# Patient Record
Sex: Female | Born: 1955 | Race: White | Hispanic: No | Marital: Married | Smoking: Current every day smoker
Health system: Southern US, Community
[De-identification: ages and names within clinical notes are randomized; demographics above are authoritative.]

## PROBLEM LIST (undated history)

## (undated) ENCOUNTER — Emergency Department (HOSPITAL_COMMUNITY): Discharge status: Against Medical Advice | Payer: Self-pay

## (undated) DIAGNOSIS — I10 Essential (primary) hypertension: Secondary | ICD-10-CM

## (undated) DIAGNOSIS — G2581 Restless legs syndrome: Secondary | ICD-10-CM

## (undated) DIAGNOSIS — E7800 Pure hypercholesterolemia, unspecified: Secondary | ICD-10-CM

## (undated) DIAGNOSIS — G8929 Other chronic pain: Secondary | ICD-10-CM

## (undated) DIAGNOSIS — E079 Disorder of thyroid, unspecified: Secondary | ICD-10-CM

## (undated) HISTORY — PX: BACK SURGERY: SHX140

## (undated) HISTORY — PX: NECK SURGERY: SHX720

---

## 2002-09-26 ENCOUNTER — Ambulatory Visit (HOSPITAL_COMMUNITY): Admission: RE | Admit: 2002-09-26 | Discharge: 2002-09-26 | Payer: Self-pay | Admitting: *Deleted

## 2002-09-26 ENCOUNTER — Encounter: Payer: Self-pay | Admitting: *Deleted

## 2003-07-17 ENCOUNTER — Encounter: Admission: RE | Admit: 2003-07-17 | Discharge: 2003-07-17 | Payer: Self-pay | Admitting: Neurosurgery

## 2003-07-27 ENCOUNTER — Ambulatory Visit (HOSPITAL_COMMUNITY): Admission: RE | Admit: 2003-07-27 | Discharge: 2003-07-27 | Payer: Self-pay | Admitting: Neurosurgery

## 2003-11-06 ENCOUNTER — Ambulatory Visit (HOSPITAL_COMMUNITY): Admission: RE | Admit: 2003-11-06 | Discharge: 2003-11-06 | Payer: Self-pay | Admitting: Neurosurgery

## 2004-06-29 ENCOUNTER — Encounter: Admission: RE | Admit: 2004-06-29 | Discharge: 2004-06-29 | Payer: Self-pay | Admitting: Neurosurgery

## 2004-07-13 ENCOUNTER — Encounter: Admission: RE | Admit: 2004-07-13 | Discharge: 2004-07-13 | Payer: Self-pay | Admitting: Neurosurgery

## 2004-08-18 ENCOUNTER — Ambulatory Visit (HOSPITAL_COMMUNITY): Admission: RE | Admit: 2004-08-18 | Discharge: 2004-08-18 | Payer: Self-pay | Admitting: Neurosurgery

## 2004-08-31 ENCOUNTER — Inpatient Hospital Stay (HOSPITAL_COMMUNITY): Admission: RE | Admit: 2004-08-31 | Discharge: 2004-09-01 | Payer: Self-pay | Admitting: Neurosurgery

## 2006-04-23 ENCOUNTER — Other Ambulatory Visit: Admission: RE | Admit: 2006-04-23 | Discharge: 2006-04-23 | Payer: Self-pay | Admitting: Family Medicine

## 2007-07-02 ENCOUNTER — Encounter: Admission: RE | Admit: 2007-07-02 | Discharge: 2007-07-02 | Payer: Self-pay | Admitting: Orthopedic Surgery

## 2007-10-23 ENCOUNTER — Ambulatory Visit (HOSPITAL_COMMUNITY): Admission: RE | Admit: 2007-10-23 | Discharge: 2007-10-24 | Payer: Self-pay | Admitting: Neurosurgery

## 2009-07-06 ENCOUNTER — Encounter: Admission: RE | Admit: 2009-07-06 | Discharge: 2009-07-06 | Payer: Self-pay | Admitting: Family Medicine

## 2010-08-30 NOTE — Op Note (Signed)
NAME:  Dawn Jacobs, Dawn Jacobs              ACCOUNT NO.:  [id number]   MEDICAL RECORD NO.:  [id number]          PATIENT TYPE:  OIB   LOCATION:  3537                         FACILITY:  [facility]   PHYSICIAN:  Coletta Memos, M.D.     DATE OF BIRTH:  May 19, 1955   DATE OF PROCEDURE:  10/23/2007  DATE OF DISCHARGE:                               OPERATIVE REPORT   PREOPERATIVE DIAGNOSES:  1. Cervical spondylosis C5-6, C6-7.  2. Cervical degenerative disk disease C5-6, C6-7.  3. Cervical radiculopathy, cervical stenosis.   POSTOPERATIVE DIAGNOSES:  1. Cervical spondylosis C5-6, C6-7.  2. Cervical degenerative disk disease C5-6, C6-7.  3. Cervical radiculopathy, cervical stenosis.   PROCEDURES:  1. Anterior cervical decompression at C5-6, C6-7.  2. Arthrodesis C5-6, 6-mm allograft, C6-7, 7 mm allograft.  3. Anterior instrumentation Vector plate with 16-XW self-tapping screw      from C5-C7.   COMPLICATIONS:  None.   SURGEON:  Coletta Memos, MD   ANESTHESIA:  General endotracheal.   INDICATIONS:  Dawn Jacobs presented with significant neck and upper  extremity pain.  Cervical MRI shows a great deal of spondylitic changes  and stenosis at 5-6 and 6-7.  She had spondylosis at 4-5, but no canal  or significant foraminal narrowing.  I therefore offered and she  accepted decompression at 5-6 and 6-7 level secondary to continued pain.   OPERATIVE NOTE:  Dawn Jacobs was brought to the operating room.  She was  intubated and placed under a general anesthetic.  She was positioned on  a horseshoe headrest with her head in slight extension.  Her neck was  prepped and she was draped in a sterile fashion.  I injected  approximately 4 mL of 0.5% lidocaine 1:200,000 strength with epinephrine  starting from the midline extending to the medial border of the left  sternocleidomastoid muscle.  I opened the skin with a #10 blade and took  the initial incision down to the level of the platysma.  I opened the  platysma in a horizontal fashion with Metzenbaum scissors.  I then  dissected both superiorly and inferiorly to the plane of the platysma  muscle to increase my working space within the wound.  Then, with blunt  and sharp dissection, I was able to create an avascular corridor to the  cervical spine.  I placed a spinal needle which I believed to be at 6-7.  I placed the same spinal needle moving at one level and was able to then  confirm that that was at C5-6.  I then prepared for the procedure by  reflecting the longus colli muscles bilaterally from C5-7.  I opened  both disk spaces with a #15 blade and did some preliminary diskectomy.  I then placed distraction pins one at C5, and one at C6, and distracted  the disk space.  I brought the microscope into the operative field and  then proceeded with the diskectomy and decompression at C5-6.   I used a high-speed drill and Kerrison punches to remove what were very  large osteophytes posteriorly.  She had a great deal of thickening in  the ligament and some calcification.  I was able to remove the posterior  longitudinal ligament and disk material and soft tissue material until  the thecal sac was well seen.  I then fully decompressed the thecal sac  at this disk level using Kerrison punches and a high-speed drill.  I  used curettes also to remove soft tissue in the lateral gutters and  around the uncovertebral joints.  I then aggressively decompressed both  C6 nerve roots using a Kerrison punch and a drill.  I was able to  decompress them through the neural foramen on both the right and left  sides.  After decompressing the canal and nerve roots, I then prepared  for arthrodesis.  I used a high-speed drill to even out the C6 and C5  vertebral bodies in the disk space.  I then placed a 6-mm graft.  I then  placed some Gelfoam at the side of the graft to control some oozing.  I  now turned my attention to the C6-7 level.   I removed the  distraction pins at C5 and placed it into C7.  I  distracted the disk space.  I then again brought the microscope into the  operative field and completed the diskectomy using high-speed drill,  Kerrison punches, and curettes.  I then removed again what were  impressive osteophytes in the uncovertebral region and in the disk  space.  I was able to fully decompress both C7 nerve roots and the  spinal canal at this level.  Having completed the decompression, I  turned my attention to the arthrodesis.   I placed a 7-mm graft after using the drill to again even out the  vertebral bodies at the disk space between C6 and C7.  I placed a graft  without difficulty.  I controlled the bleeding with Gelfoam.  I then  prepared for instrumentation.  I used a Vector plate and placed 6 screws  first by using hand drill and then self-tapping screws for one at C5,  C6, and C7.  Two screws placed at each level.  X-ray showed the plate  and screws to be in good position.  I then irrigated the wound.  I then  closed the wound after achieving hemostasis with Vicryl sutures to  reapproximate the platysma and subcuticular layers.  I applied Dermabond  for sterile dressing.           ______________________________  Coletta Memos, M.D.     KC/MEDQ  D:  10/23/2007  T:  10/24/2007  Job:  [id number]

## 2010-09-02 NOTE — Op Note (Signed)
NAME:  Dawn Jacobs, Dawn Jacobs              ACCOUNT NO.:  [id number]   MEDICAL RECORD NO.:  [id number]            PATIENT TYPE:   LOCATION:                                 FACILITY:   PHYSICIAN:  Coletta Memos, M.D.          DATE OF BIRTH:   DATE OF PROCEDURE:  08/31/2004  DATE OF DISCHARGE:                                 OPERATIVE REPORT   PREOPERATIVE DIAGNOSIS:  1.  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  2.  Lumbar radiculopathy.   POSTOPERATIVE DIAGNOSIS:  Lumbar radiculopathy.   PROCEDURE:  1.  Left L5-S1 transverse lumbar interbody fusion with 10-mm Synthes K-cage.  2.  Interbody arthrodesis using morselized allograft and bone morphogenetic      protein.  3.  Posterolateral lumbar fusion L4-5 using morselized allograft and bone      marrow harvest.  4.  Posterior spinal fusion nonsegmental L4-L5 with a large spire plate.   COMPLICATIONS:  None.   SURGEON:  Coletta Memos, M.D.   ASSISTANT:  Hilda Lias, M.D.   INDICATIONS:  Patient is a 55 year old woman whom I performed 2 previous  diskectomies on. She reported persistent pain in the left lower extremity.  She underwent a myelogram which showed that there was deflection of the sac  on the left side where previous operation had been. It was not clear at all  that this was a recurrent disk, but there was a soft tissue mass there. I  then offered, and she agreed, to undergo a redo diskectomy and arthrodesis  sine this was her third operation at that level.   OPERATIVE NOTE:  Dawn Jacobs was brought to the operating room, intubated,  and placed under a general anesthetic without difficulty. Her back was  prepped and she was draped in a sterile fashion. I infiltrated 20 cc 1/2%  lidocaine 1:200,000 strength epinephrine into the lumbar region using my  previous incision as a guide. I opened the skin going approximately 1-2 cm  both rostral and caudal to the end of my previous incision encompassing the  previous incision. I exposed  the lamina of L4-5.  I took another x-ray to  ensure I was in the correct interlaminar space. I was in the correct space;  and I did a redo diskectomy at L4-5 with microdissection. I did not find a  recurrent disk herniation. She had scar tissue there, but there is no  obvious frank recompression of the nerve root. She certainly had a  significant amount of epidural fat, but that was soft. Again, she did not  have a recurrent disk herniation.   I performed the diskectomy using that T-lift tools, rasp, and cutting  instruments along with __________ tools.  I created a space for a 10-mm, T-  lift graft. With Dr. Cassandria Santee assistance we did that using a spinous process  distractor. The 10-mm cage was placed with morselized allograft into the  interbody space; also with the BMP placed anterior to the cage. After that  was done, I placed more bone posterior to the cage and lateral to it. I  placed bone and BMP into the left L4-5 facette joint after decorticating the  bone. I also placed BMP alongside the lamina of L4-L5 on the  right side after decorticating the lamina on that side. I then placed a  large spire plate without difficulty at L4-5 with Dr. Cassandria Santee assistance. X-  rays showed the plate and cage to be in good position. I then closed in a  running layer fashion using Vicryl sutures. Dermabond used for a sterile  dressing. The patient tolerated the procedure well.                                        ___________________________________________  Coletta Memos, M.D.    KC/MEDQ  D:  08/31/2004  T:  08/31/2004  Job:  [id number]

## 2010-09-02 NOTE — Op Note (Signed)
NAME:  [name], [name]                        ACCOUNT NO.:  [id number]   MEDICAL RECORD NO.:  [id number]                   PATIENT TYPE:  OIB   LOCATION:  3003                                 FACILITY:  [facility]   PHYSICIAN:  Coletta Memos, M.D.                  DATE OF BIRTH:  Dec 03, 1955   DATE OF PROCEDURE:  11/06/2003  DATE OF DISCHARGE:  11/06/2003                                 OPERATIVE REPORT   PREOPERATIVE DIAGNOSES:  1. Recurrent disk herniation, L4-5, left.  2. Lumbar radiculopathy.   POSTOPERATIVE DIAGNOSES:  1. Recurrent disk herniation, L4-5, left.  2. Lumbar radiculopathy.   PROCEDURE:  Re-do diskectomy, L4-5, with microdissection.   COMPLICATIONS:  None.   SURGEON:  Coletta Memos, M.D.   ASSISTANT:  Hewitt Shorts, M.D.   INDICATIONS:  Dawn Jacobs is a 55 year old whom I took to the operating  room in April of 2005 for a herniated disk at L4-5 causing left lower  extremity pain.  She did well immediately after surgery, then started to  have some problems with heaviness and pain at times in the lower  extremities.  A repeat MRI showed that she had a very large recurrence at L4-  5.  I therefore recommended and she agreed to undergo redo diskectomy at L4-  5.   OPERATIVE NOTE:  Dawn Jacobs was brought to the operating room and placed  under a general anesthetic without difficulty.  She was rolled prone onto a  Wilson frame and all pressure points were properly padded.  Her skin was  prepped and she was draped in a sterile fashion.  I infiltrated 7 cubic  centimeters of 0.5% lidocaine, 1:200,000 strength epinephrine into the  lumbar region.  I opened the skin with a #10 blade and took that incision  down to the thoracolumbar fascia.  I then exposed the lamina of L4 and L5.  I took an x-ray and it showed that I was at the L5-S1 interlaminar position,  so then I moved up one interlaminar space to the correct level.  I then  dissected scar from the undersurface  of the L4 lamina and the lateral  margins of the bony opening using a curet.  I was able to retract the thecal  sac medially and, then, found disk material.  I then removed approximately 4-  5 large pieces of disk material, and the thecal sac then felt well  decompressed.  With Dr. Earl Gala assistance and microscopic dissection, we  further explored the disk space and the nerve root, and I did not appreciate  any other compression at that level.  I then irrigated the wound and closed  the wound in a layer fashion using Vicryl sutures.  Dermabond was used for a  sterile dressing.  The patient tolerated the procedure well, moving all  extremities.  Coletta Memos, M.D.    KC/MEDQ  D:  11/06/2003  T:  11/07/2003  Job:  [id number]

## 2010-09-02 NOTE — Op Note (Signed)
NAME:  Dawn Jacobs, Dawn Jacobs                        ACCOUNT NO.:  [id number]   MEDICAL RECORD NO.:  [id number]                   PATIENT TYPE:  OIB   LOCATION:  3014                                 FACILITY:  [facility]   PHYSICIAN:  Coletta Memos, M.D.                  DATE OF BIRTH:  12/16/1955   DATE OF PROCEDURE:  07/27/2003  DATE OF DISCHARGE:                                 OPERATIVE REPORT   PREOPERATIVE DIAGNOSES:  1. Displaced disk, left L4-5.  2. Left L5 radiculopathy.   POSTOPERATIVE DIAGNOSES:  1. Displaced disk, left L4-5.  2. Left L5 radiculopathy.   PROCEDURE:  Left L4 semi-hemilaminectomy and diskectomy with micro-  dissection.   SURGEON:  Coletta Memos, M.D.   ASSISTANT:  Hilda Lias, M.D.   COMPLICATIONS:  None.   ANESTHESIA:  General endotracheal.   INDICATIONS FOR PROCEDURE:  The patient is a 55 year old who has been  treated conservatively for approximately one year for a herniated disk at L5  on the left side.  I therefore recommended an operative decompression.  She  agreed and wished to proceed.   DESCRIPTION OF PROCEDURE:  The patient was brought to the operating room,  intubated, and placed under with general anesthetic without difficulty.  She  was rolled prone onto the Wilson frame and all pressure points were properly  padded.  Her back was prepped, and she was draped in a sterile fashion.  I  infiltrated 6 mL, 0.5% lidocaine, 1:200,000 strength epinephrine into the  lumbar region and the paraspinous musculature on the left side.  I opened  the skin with a #10 blade and took that down to the thoracolumbar fascia  sharply.  Then using monopolar cautery I exposed the lamina of L4 and L5.  I  took another x-ray and added a double ________ knife placed underneath the  lamina of L5.  The moving to the correct level, I performed a semi-  hemilaminectomy of L4 using a high-speed air drill and Kerrison punches.  I  removed the ligament of flavum with a  rostral and caudal direction, exposing  the thecal sac and L5 nerve root.  I was able to retract that medially and  easily identified what was a large disk herniation at the L4-5 disk space on  the left side.  I used bipolar cautery to cauterize a few epidural veins.  I  divided them sharply to fully expose the disk space.  I then used a #15  blade to open the disk, cutting a small square into the disk space.  Then  with a combination of pituitary rongeurs and Epstein curets, I emptied the  disk space with the help of Dr. Jeral Fruit and with micro-dissection.  After I  felt that there was adequate decompression of the disk space and of the left  L5 nerve root, I inspected it with a small coronary dilator probe.  I felt  that the nerve was very well decompressed and that there were no residual  disk fragments left.  I again inspected the disk space with Dr. Jeral Fruit.  I was then satisfied with my decompression.  I irrigated the wound with  normal saline.  We then closed the wound in a layered fashion using Vicryl  sutures to reapproximate the thoracolumbar fascia and subcutaneous tissues.  Also ___________Dermabond used for a sterile dressing.                                               Coletta Memos, M.D.    KC/MEDQ  D:  07/27/2003  T:  07/28/2003  Job:  [id number]

## 2011-01-12 LAB — CBC
Hemoglobin: 13.9
MCHC: 34.2
Platelets: 214
RBC: 4.24

## 2011-01-12 LAB — BASIC METABOLIC PANEL
CO2: 27
Chloride: 102
Creatinine, Ser: 0.79
GFR calc Af Amer: >60
Glucose, Bld: 90
Sodium: 136

## 2012-08-06 ENCOUNTER — Telehealth: Payer: Self-pay | Admitting: Medical Oncology

## 2012-08-15 NOTE — Telephone Encounter (Signed)
Opened in error

## 2014-11-05 ENCOUNTER — Other Ambulatory Visit: Payer: Self-pay | Admitting: Obstetrics & Gynecology

## 2014-11-05 DIAGNOSIS — R928 Other abnormal and inconclusive findings on diagnostic imaging of breast: Secondary | ICD-10-CM

## 2014-11-10 ENCOUNTER — Ambulatory Visit
Admission: RE | Admit: 2014-11-10 | Discharge: 2014-11-10 | Disposition: A | Discharge status: Home / Self Care | Payer: BLUE CROSS/BLUE SHIELD | Source: Ambulatory Visit | Attending: Obstetrics & Gynecology | Admitting: Obstetrics & Gynecology

## 2014-11-10 DIAGNOSIS — R928 Other abnormal and inconclusive findings on diagnostic imaging of breast: Secondary | ICD-10-CM

## 2016-11-12 ENCOUNTER — Ambulatory Visit (HOSPITAL_COMMUNITY)
Admission: EM | Admit: 2016-11-12 | Discharge: 2016-11-12 | Disposition: A | Discharge status: Home / Self Care | Payer: BLUE CROSS/BLUE SHIELD

## 2016-11-12 ENCOUNTER — Emergency Department (HOSPITAL_COMMUNITY)
Admission: EM | Admit: 2016-11-12 | Discharge: 2016-11-12 | Disposition: A | Discharge status: Home / Self Care | Payer: BLUE CROSS/BLUE SHIELD | Attending: Emergency Medicine | Admitting: Emergency Medicine

## 2016-11-12 ENCOUNTER — Encounter (HOSPITAL_COMMUNITY): Payer: Self-pay | Admitting: Emergency Medicine

## 2016-11-12 DIAGNOSIS — F17200 Nicotine dependence, unspecified, uncomplicated: Secondary | ICD-10-CM | POA: Insufficient documentation

## 2016-11-12 DIAGNOSIS — Z79899 Other long term (current) drug therapy: Secondary | ICD-10-CM | POA: Insufficient documentation

## 2016-11-12 DIAGNOSIS — I10 Essential (primary) hypertension: Secondary | ICD-10-CM | POA: Insufficient documentation

## 2016-11-12 DIAGNOSIS — W25XXXA Contact with sharp glass, initial encounter: Secondary | ICD-10-CM | POA: Insufficient documentation

## 2016-11-12 DIAGNOSIS — Y92007 Garden or yard of unspecified non-institutional (private) residence as the place of occurrence of the external cause: Secondary | ICD-10-CM | POA: Insufficient documentation

## 2016-11-12 DIAGNOSIS — Y93H9 Activity, other involving exterior property and land maintenance, building and construction: Secondary | ICD-10-CM | POA: Insufficient documentation

## 2016-11-12 DIAGNOSIS — Y999 Unspecified external cause status: Secondary | ICD-10-CM | POA: Insufficient documentation

## 2016-11-12 DIAGNOSIS — S61210A Laceration without foreign body of right index finger without damage to nail, initial encounter: Secondary | ICD-10-CM

## 2016-11-12 HISTORY — DX: Disorder of thyroid, unspecified: E07.9

## 2016-11-12 HISTORY — DX: Pure hypercholesterolemia, unspecified: E78.00

## 2016-11-12 HISTORY — DX: Restless legs syndrome: G25.81

## 2016-11-12 HISTORY — DX: Other chronic pain: G89.29

## 2016-11-12 HISTORY — DX: Essential (primary) hypertension: I10

## 2016-11-12 MED ORDER — LIDOCAINE HCL 2 % IJ SOLN
5.0000 mL | Freq: Once | INTRAMUSCULAR | Status: AC
  Administered 2016-11-12: 5 mL
  Filled 2016-11-12: qty 20

## 2016-11-12 MED ORDER — BACITRACIN ZINC 500 UNIT/GM EX OINT
TOPICAL_OINTMENT | Freq: Once | CUTANEOUS | Status: AC
  Administered 2016-11-12: 1 via TOPICAL

## 2016-11-12 NOTE — ED Provider Notes (Signed)
[facility]-EMERGENCY DEPT Provider Note   CSN: [id number] Arrival date & time: 11/12/16  2110 By signing my name below, I, Levon HedgerElizabeth Hall, attest that this documentation has been prepared under the direction and in the presence of non-physician practitioner, Shanna CiscoJamie Ward, PA-C. Electronically Signed: Levon HedgerElizabeth Hall, Scribe. 11/12/2016. 10:04 PM.   History   Chief Complaint Chief Complaint  Patient presents with  . Finger Injury   HPI Dawn Jacobs is a 61 y.o. female who presents to the Emergency Department complaining of a laceration to her right index finger sustained tonight ~3 hours PTA. Pt states she was attempting to knock down a wasps nest on a window and struck the window with her fist. The window shattered and a piece of broken glass cut her finger. She reports associated sudden onset, moderate, stinging pain to the area. She has not cleaned the wound. No OTC treatments tried for these symptoms PTA.  Tetanus UTD. She denies any numbness, tingling, or decreased ROM.   The history is provided by the patient. No language interpreter was used.   Past Medical History:  Diagnosis Date  . Chronic pain   . High cholesterol   . Hypertension   . Restless legs   . Thyroid disease     There are no active problems to display for this patient.   Past Surgical History:  Procedure Laterality Date  . BACK SURGERY    . NECK SURGERY      OB History    No data available       Home Medications    Prior to Admission medications   Medication Sig Start Date End Date Taking? Authorizing Provider  Estradiol (ESTRACE PO) Take by mouth.    [provider]  HYDROcodone-acetaminophen (NORCO/VICODIN) 5-325 MG tablet Take 1 tablet by mouth every 6 (six) hours as needed for moderate pain.    [provider]  Levothyroxine Sodium (SYNTHROID PO) Take by mouth.    [provider]  LISINOPRIL PO Take by mouth.    [provider]  ROPINIROLE HCL ER PO Take by mouth.     [provider]  SIMVASTATIN PO Take by mouth.    [provider]    Family History No family history on file.  Social History Social History  Substance Use Topics  . Smoking status: Current Every Day Smoker  . Smokeless tobacco: Not on file  . Alcohol use No     Allergies   Patient has no known allergies.   Review of Systems Review of Systems  Skin: Positive for wound.  Neurological: Negative for weakness and numbness.   Physical Exam Updated Vital Signs BP 129/74 (BP Location: Right Arm)   Pulse 78   Temp 98.1 F (36.7 C) (Oral)   Resp 16   Ht 5' 3.5" (1.613 m)   Wt 74.8 kg (165 lb)   SpO2 99%   BMI 28.77 kg/m   Physical Exam  Constitutional: She appears well-developed and well-nourished. No distress.  HENT:  Head: Normocephalic and atraumatic.  Neck: Neck supple.  Cardiovascular: Normal rate, regular rhythm and normal heart sounds.   No murmur heard. Pulmonary/Chest: Effort normal and breath sounds normal. No respiratory distress. She has no wheezes. She has no rales.  Musculoskeletal: Normal range of motion.  2 cm laceration to dorsal aspect of right index finger. Sensation intact. FROM. 2+ radial pulse.   Neurological: She is alert.  Skin: Skin is warm and dry.  Nursing note and vitals reviewed.  ED Treatments / Results  DIAGNOSTIC STUDIES:  Oxygen Saturation is 99% on RA, normal by my interpretation.    COORDINATION OF CARE:  10:04 PM Discussed treatment plan which includes laceration repair with pt at bedside and pt agreed to plan.   Labs (all labs ordered are listed, but only abnormal results are displayed) Labs Reviewed - No data to display  EKG  EKG Interpretation None      Radiology No results found.  Procedures .Marland Kitchen.Laceration Repair Date/Time: 11/12/2016 10:07 PM Performed by: Janyth ContesWARD, JAIME PILCHER Authorized by: Janyth ContesWARD, JAIME PILCHER   Consent:    Consent obtained:  Verbal   Consent given by:   Patient Anesthesia (see MAR for exact dosages):    Anesthesia method:  Local infiltration   Local anesthetic:  Lidocaine 2% w/o epi Laceration details:    Location:  Finger   Finger location:  R index finger   Length (cm):  2 Repair type:    Repair type:  Simple Pre-procedure details:    Preparation:  Patient was prepped and draped in usual sterile fashion Exploration:    Contaminated: no   Treatment:    Area cleansed with:  Betadine   Amount of cleaning:  Standard   Irrigation solution:  Sterile saline   Visualized foreign bodies/material removed: no   Skin repair:    Repair method:  Sutures   Suture size:  5-0   Suture material:  Prolene   Suture technique:  Simple interrupted   Number of sutures: 3. Approximation:    Approximation:  Close   Vermilion border: well-aligned   Post-procedure details:    Dressing:  Tube gauze and antibiotic ointment   Patient tolerance of procedure:  Tolerated well, no immediate complications    (including critical care time)  Medications Ordered in ED Medications  lidocaine (XYLOCAINE) 2 % (with pres) injection 100 mg (5 mLs Infiltration Given 11/12/16 2255)  bacitracin ointment (1 application Topical Given 11/12/16 2255)     Initial Impression / Assessment and Plan / ED Course  I have reviewed the triage vital signs and the nursing notes.  Pertinent labs & imaging results that were available during my care of the patient were reviewed by me and considered in my medical decision making (see chart for details).    Dawn Jacobs is a 61 y.o. female who presents to ED for laceration of index finger. Wound thoroughly cleaned in ED today. Wound explored and bottom of wound seen in a bloodless field. Laceration repaired as dictated above. Patient counseled on home wound care. Follow up with PCP/urgent care or return to ER for suture removal in 10 days. Patient was urged to return to the Emergency Department for worsening pain, swelling,  expanding erythema especially if it streaks away from the affected area, fever, or for any additional concerns. Patient verbalized understanding. All questions answered.   Final Clinical Impressions(s) / ED Diagnoses   Final diagnoses:  Laceration of right index finger without foreign body without damage to nail, initial encounter    New Prescriptions Discharge Medication List as of 11/12/2016 10:51 PM     I personally performed the services described in this documentation, which was scribed in my presence. The recorded information has been reviewed and is accurate.    Ward, Chase PicketJaime Pilcher, PA-C 11/13/16 [id number]    Maia PlanLong, Joshua G, MD 11/15/16 [id number]

## 2016-11-12 NOTE — ED Triage Notes (Signed)
Laceration occurred approx 3 hours ago.  .  Laceration to right index finger.  Laceration across knuckle.  Able to bend and straighten right index finger

## 2016-11-12 NOTE — Discharge Instructions (Signed)
It was my pleasure taking care of you today!   Keep wound clean with mild soap and water. Keep area covered with a topical antibiotic ointment and bandage, keep bandage dry, and do not submerge in water for 24 hours. Ice and elevate for additional pain relief and swelling. Alternate between ibuprofen and Tylenol for additional pain relief. [facility] or ER in approximately 10 days for wound recheck and suture removal. Monitor area for signs of infection to include, but not limited to: increasing pain, spreading redness, drainage/pus, worsening swelling, or fevers. Return to emergency department for emergent changing or worsening symptoms.   WOUND CARE Keep area clean and dry for 24 hours. Do not remove bandage, if applied. After 24 hours,you should change it at least once a day. Also, change the dressing if it becomes wet or dirty, or as directed by your caregiver.  Wash the wound with soap and water 2 times a day. Rinse the wound off with water to remove all soap. Pat the wound dry with a clean towel.  You may shower as usual after the first 24 hours. Do not soak the wound in water until the sutures are removed.  Return if you experience any of the following signs of infection: Swelling, redness, pus drainage, streaking, fever >101.0 Return if you experience excessive bleeding that does not stop after 15-20 minutes of constant, firm pressure.

## 2016-11-12 NOTE — Progress Notes (Signed)
Orthopedic Tech Progress Note Patient Details:  Dawn JarredCynthia L Jacobs 1956/01/30 [id number]  Ortho Devices Type of Ortho Device: Finger splint Ortho Device/Splint Location: applied splint to right index finger.  pt tolerated application very well.   Right Hand/ Index (pointer) finger.  Ortho Device/Splint Interventions: Application, Adjustment   Alvina ChouWilliams, Zaki Gertsch C 11/12/2016, 11:31 PM

## 2016-11-12 NOTE — ED Triage Notes (Signed)
Patient declines xray at this time. Requests to see provider prior to having imaging done.

## 2016-11-12 NOTE — ED Notes (Signed)
PA at bedside for suture repair. 

## 2016-11-12 NOTE — ED Triage Notes (Signed)
Patient arrives with complaint of right index finger injury. States that she cut her hand on a piece of broken glass.  [facility] Cut is across knuckle of finger.

## 2016-11-12 NOTE — ED Notes (Signed)
Wound to finger dressed with bacitracin, 2x2 and gauze. Ortho paged for static splint

## 2017-07-24 ENCOUNTER — Other Ambulatory Visit: Payer: Self-pay | Admitting: Obstetrics & Gynecology

## 2017-11-15 ENCOUNTER — Other Ambulatory Visit: Payer: Self-pay | Admitting: Obstetrics & Gynecology

## 2017-11-15 DIAGNOSIS — N6310 Unspecified lump in the right breast, unspecified quadrant: Secondary | ICD-10-CM

## 2017-11-21 ENCOUNTER — Ambulatory Visit
Admission: RE | Admit: 2017-11-21 | Discharge: 2017-11-21 | Disposition: A | Discharge status: Home / Self Care | Payer: No Typology Code available for payment source | Source: Ambulatory Visit | Attending: Obstetrics & Gynecology | Admitting: Obstetrics & Gynecology

## 2017-11-21 ENCOUNTER — Ambulatory Visit
Admission: RE | Admit: 2017-11-21 | Discharge: 2017-11-21 | Disposition: A | Discharge status: Home / Self Care | Payer: BLUE CROSS/BLUE SHIELD | Source: Ambulatory Visit | Attending: Obstetrics & Gynecology | Admitting: Obstetrics & Gynecology

## 2017-11-21 DIAGNOSIS — N6310 Unspecified lump in the right breast, unspecified quadrant: Secondary | ICD-10-CM

## 2019-07-11 ENCOUNTER — Ambulatory Visit: Payer: Self-pay | Attending: Internal Medicine

## 2019-07-11 DIAGNOSIS — Z23 Encounter for immunization: Secondary | ICD-10-CM

## 2019-07-11 NOTE — Progress Notes (Signed)
   Covid-19 Vaccination Clinic  Name:  MIKIYA NEBERGALL    MRN: [id number] DOB: April 08, 1956  07/11/2019  Ms. Pippenger was observed post Covid-19 immunization for 15 minutes without incident. She was provided with Vaccine Information Sheet and instruction to access the V-Safe system.   Ms. Thammavong was instructed to call 911 with any severe reactions post vaccine: Marland Kitchen Difficulty breathing  . Swelling of face and throat  . A fast heartbeat  . A bad rash all over body  . Dizziness and weakness   Immunizations Administered    Name Date Dose VIS Date Route   Pfizer COVID-19 Vaccine 07/11/2019  2:52 PM 0.3 mL 03/28/2019 Intramuscular   Manufacturer: [employer], [employer]   Lot: [id number]   NDC: [address]-2

## 2019-08-05 ENCOUNTER — Ambulatory Visit: Payer: Self-pay | Attending: Internal Medicine

## 2019-08-05 DIAGNOSIS — Z23 Encounter for immunization: Secondary | ICD-10-CM

## 2019-08-05 NOTE — Progress Notes (Signed)
   Covid-19 Vaccination Clinic  Name:  Dawn Jacobs    MRN: [id number] DOB: 1955/12/19  08/05/2019  [name] was observed post Covid-19 immunization for 15 minutes without incident. She was provided with Vaccine Information Sheet and instruction to access the V-Safe system.   [name] was instructed to call 911 with any severe reactions post vaccine: Marland Kitchen Difficulty breathing  . Swelling of face and throat  . A fast heartbeat  . A bad rash all over body  . Dizziness and weakness   Immunizations Administered    Name Date Dose VIS Date Route   Pfizer COVID-19 Vaccine 08/05/2019  9:55 AM 0.3 mL 06/11/2018 Intramuscular   Manufacturer: [employer], [employer]   Lot: [id number]   NDC: [address]-1

## 2020-03-19 DIAGNOSIS — L243 Irritant contact dermatitis due to cosmetics: Secondary | ICD-10-CM | POA: Diagnosis not present

## 2020-03-31 DIAGNOSIS — Z90710 Acquired absence of both cervix and uterus: Secondary | ICD-10-CM | POA: Diagnosis not present

## 2020-03-31 DIAGNOSIS — Z6827 Body mass index (BMI) 27.0-27.9, adult: Secondary | ICD-10-CM | POA: Diagnosis not present

## 2020-03-31 DIAGNOSIS — Z01419 Encounter for gynecological examination (general) (routine) without abnormal findings: Secondary | ICD-10-CM | POA: Diagnosis not present

## 2020-03-31 DIAGNOSIS — Z1272 Encounter for screening for malignant neoplasm of vagina: Secondary | ICD-10-CM | POA: Diagnosis not present

## 2020-04-15 IMAGING — MG DIGITAL DIAGNOSTIC BILATERAL MAMMOGRAM WITH TOMO AND CAD
8 series · 8 of 24 positions shown · non-contrast
Comparison: Previous exam(s).

CLINICAL DATA: Patient underwent diagnostic right breast
mammography and ultrasound in October 2014, which revealed a probably
benign complicated cyst in the right breast at 10 o'clock. She was
to have a 6 month follow-up. This is her first exam since that
diagnostic study.

EXAM:
DIGITAL DIAGNOSTIC BILATERAL MAMMOGRAM WITH CAD AND TOMO
ULTRASOUND RIGHT BREAST

[R CC synth-2D]
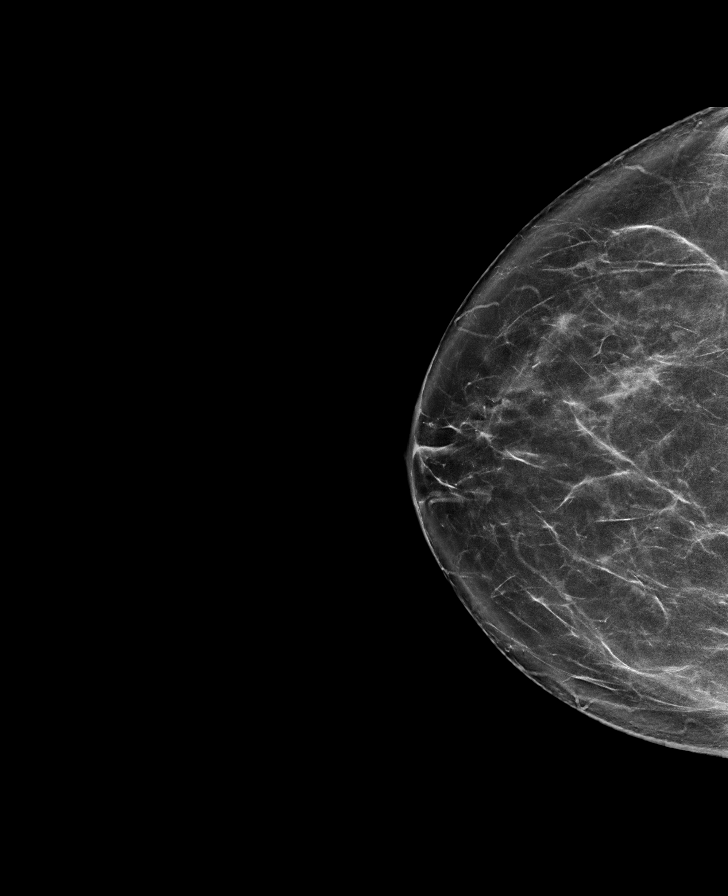

[L CC synth-2D]
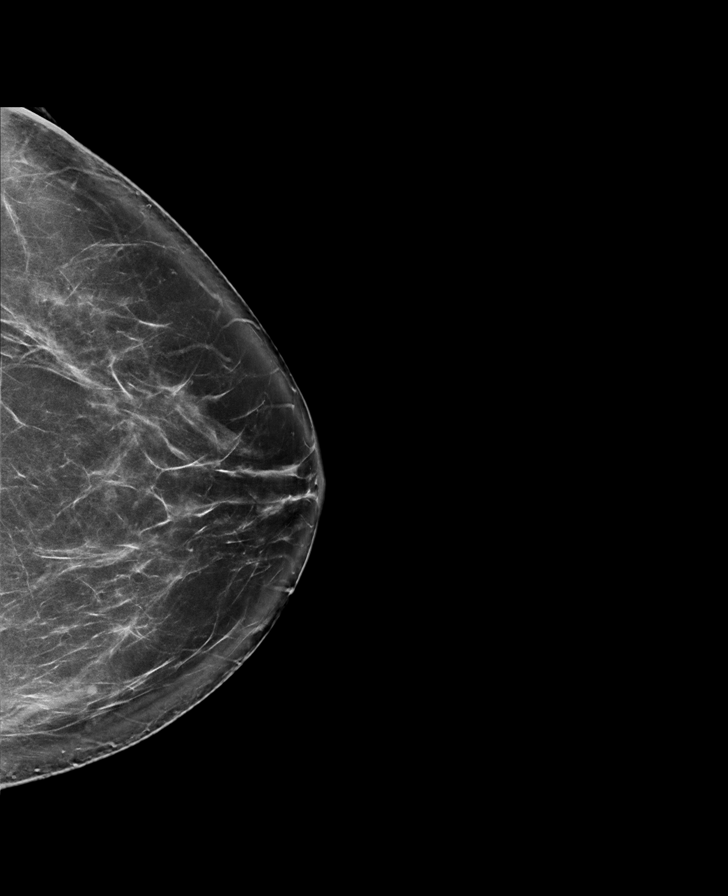

[L MLO synth-2D]
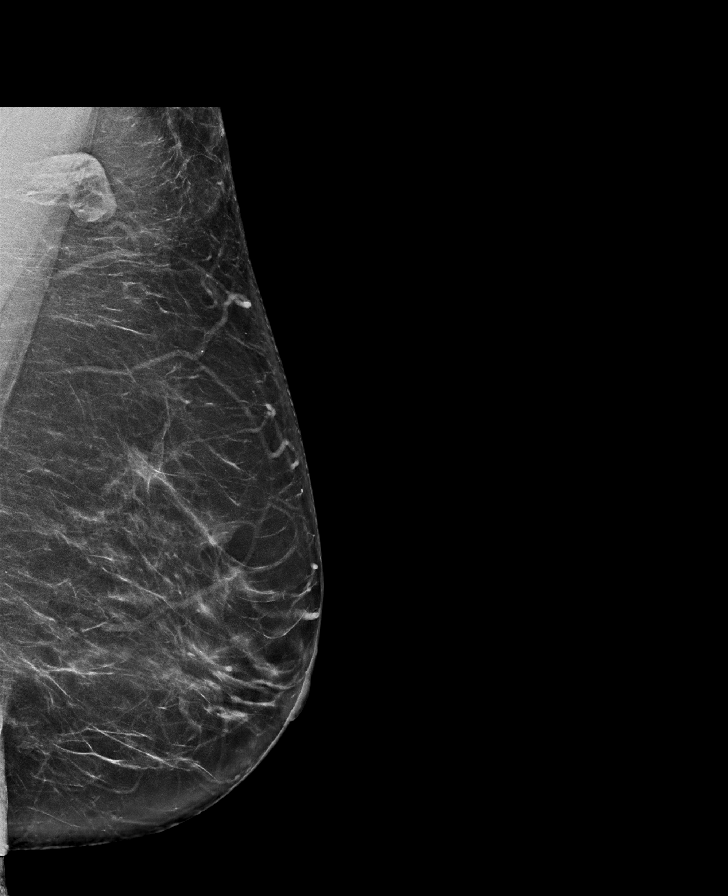

[R MLO synth-2D]
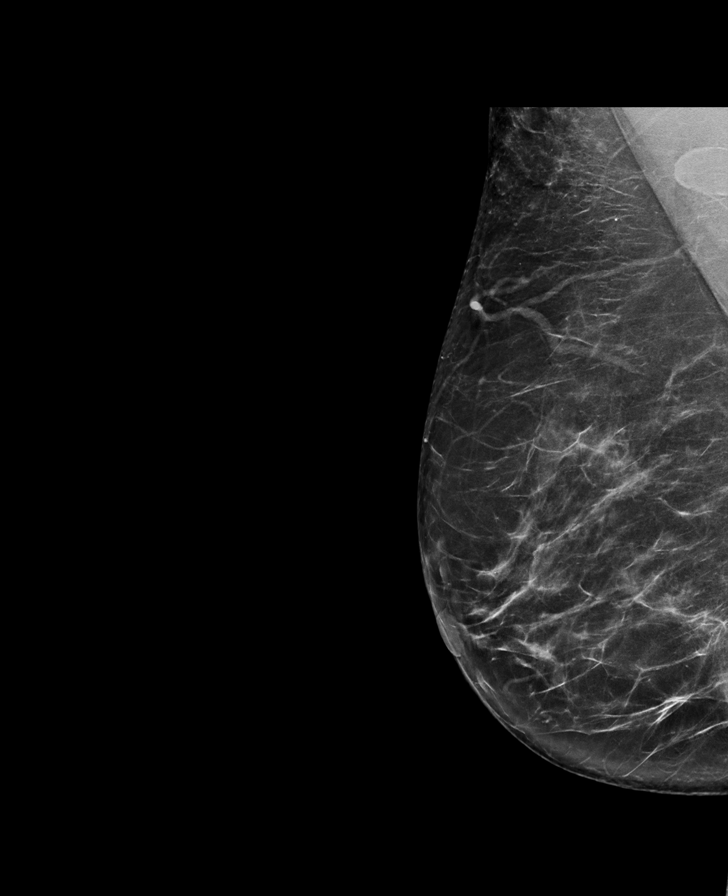

[L MLO tomo · tomo slice 41/82.0]
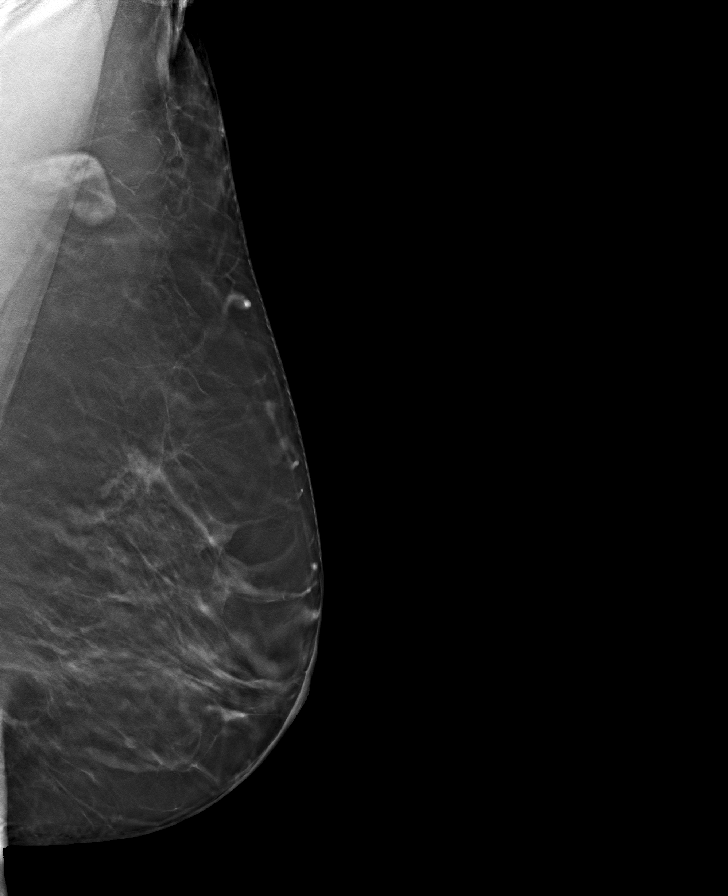

[R MLO tomo · tomo slice 41/82.0]
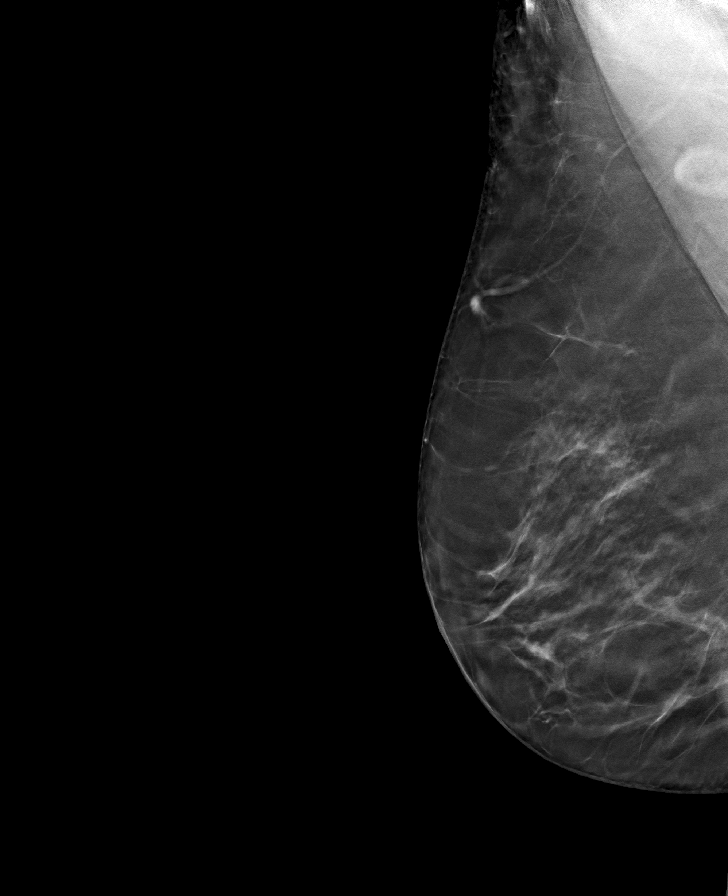

[R CC tomo · tomo slice 40/79.0]
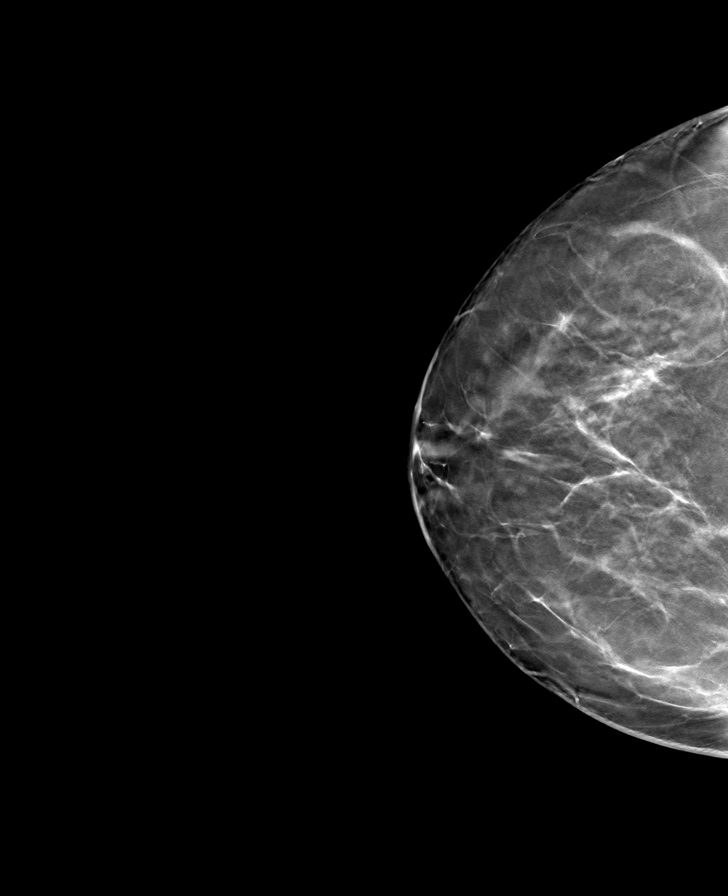

[L CC tomo · tomo slice 43/85.0]
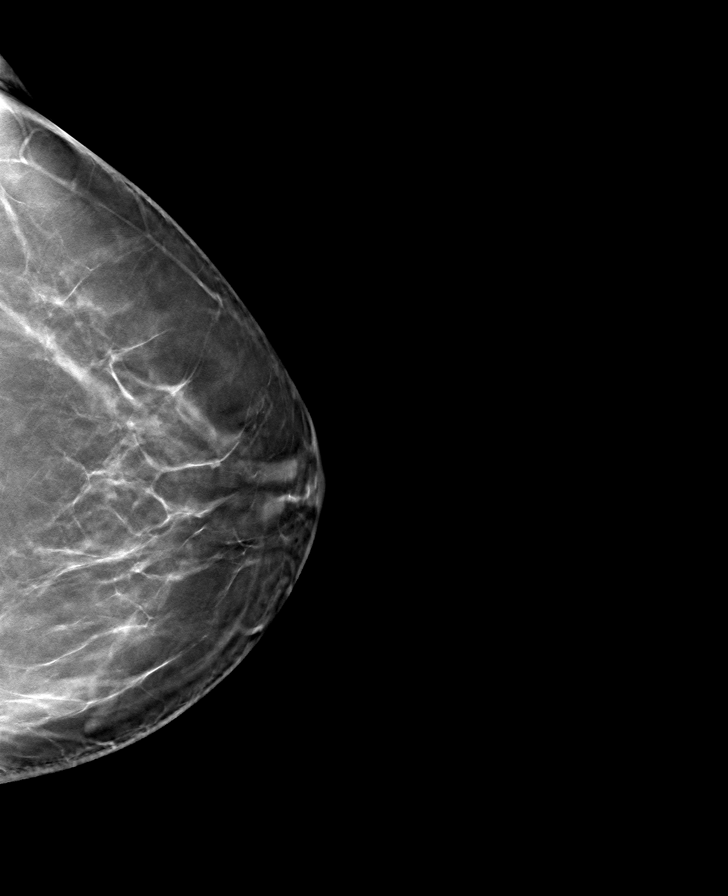

[8 of 24 positions shown; findings below may reference images not displayed]

ACR Breast Density Category b: There are scattered areas of
fibroglandular density.
FINDINGS: The mass noted in the upper outer right breast on the prior study is
no longer visualized.

There are no discrete masses or areas of architectural distortion.
There are no suspicious calcifications.

Mammographic images were processed with CAD.

Targeted ultrasound is performed, showing a small discoid shaped
cyst in the 10 o'clock position the right breast, considerably
smaller than on the prior study. There are no solid masses or
suspicious lesions.
IMPRESSION: 1. No evidence of breast malignancy.
2. Previously seen complicated right breast cyst has significantly
decreased in size confirming a benign etiology.

RECOMMENDATION:
Screening mammogram in one year.(Code:[id number])

I have discussed the findings and recommendations with the patient.
Results were also provided in writing at the conclusion of the
visit. If applicable, a reminder letter will be sent to the patient
regarding the next appointment.

BI-RADS CATEGORY  2: Benign.

## 2020-07-27 DIAGNOSIS — Z1389 Encounter for screening for other disorder: Secondary | ICD-10-CM | POA: Diagnosis not present

## 2020-07-27 DIAGNOSIS — E039 Hypothyroidism, unspecified: Secondary | ICD-10-CM | POA: Diagnosis not present

## 2020-07-27 DIAGNOSIS — Z1159 Encounter for screening for other viral diseases: Secondary | ICD-10-CM | POA: Diagnosis not present

## 2020-07-27 DIAGNOSIS — Z0000 Encounter for general adult medical examination without abnormal findings: Secondary | ICD-10-CM | POA: Diagnosis not present

## 2020-07-27 DIAGNOSIS — I10 Essential (primary) hypertension: Secondary | ICD-10-CM | POA: Diagnosis not present

## 2020-08-06 DIAGNOSIS — Z23 Encounter for immunization: Secondary | ICD-10-CM | POA: Diagnosis not present

## 2020-09-08 DIAGNOSIS — M8588 Other specified disorders of bone density and structure, other site: Secondary | ICD-10-CM | POA: Diagnosis not present

## 2020-09-08 DIAGNOSIS — Z13820 Encounter for screening for osteoporosis: Secondary | ICD-10-CM | POA: Diagnosis not present

## 2021-01-26 DIAGNOSIS — E782 Mixed hyperlipidemia: Secondary | ICD-10-CM | POA: Diagnosis not present

## 2021-01-26 DIAGNOSIS — G2581 Restless legs syndrome: Secondary | ICD-10-CM | POA: Diagnosis not present

## 2021-01-26 DIAGNOSIS — E669 Obesity, unspecified: Secondary | ICD-10-CM | POA: Diagnosis not present

## 2021-01-26 DIAGNOSIS — E039 Hypothyroidism, unspecified: Secondary | ICD-10-CM | POA: Diagnosis not present

## 2021-01-26 DIAGNOSIS — I10 Essential (primary) hypertension: Secondary | ICD-10-CM | POA: Diagnosis not present

## 2021-04-21 DIAGNOSIS — M542 Cervicalgia: Secondary | ICD-10-CM | POA: Diagnosis not present

## 2021-04-21 DIAGNOSIS — M545 Low back pain, unspecified: Secondary | ICD-10-CM | POA: Diagnosis not present

## 2021-04-21 DIAGNOSIS — M9902 Segmental and somatic dysfunction of thoracic region: Secondary | ICD-10-CM | POA: Diagnosis not present

## 2021-04-21 DIAGNOSIS — M546 Pain in thoracic spine: Secondary | ICD-10-CM | POA: Diagnosis not present

## 2021-04-21 DIAGNOSIS — M9901 Segmental and somatic dysfunction of cervical region: Secondary | ICD-10-CM | POA: Diagnosis not present

## 2021-04-21 DIAGNOSIS — M9903 Segmental and somatic dysfunction of lumbar region: Secondary | ICD-10-CM | POA: Diagnosis not present

## 2021-08-17 DIAGNOSIS — Z23 Encounter for immunization: Secondary | ICD-10-CM | POA: Diagnosis not present

## 2021-08-17 DIAGNOSIS — E039 Hypothyroidism, unspecified: Secondary | ICD-10-CM | POA: Diagnosis not present

## 2021-08-17 DIAGNOSIS — E782 Mixed hyperlipidemia: Secondary | ICD-10-CM | POA: Diagnosis not present

## 2021-08-17 DIAGNOSIS — E669 Obesity, unspecified: Secondary | ICD-10-CM | POA: Diagnosis not present

## 2021-08-17 DIAGNOSIS — Z0000 Encounter for general adult medical examination without abnormal findings: Secondary | ICD-10-CM | POA: Diagnosis not present

## 2021-08-17 DIAGNOSIS — I10 Essential (primary) hypertension: Secondary | ICD-10-CM | POA: Diagnosis not present

## 2021-08-17 DIAGNOSIS — F325 Major depressive disorder, single episode, in full remission: Secondary | ICD-10-CM | POA: Diagnosis not present

## 2021-08-17 DIAGNOSIS — G2581 Restless legs syndrome: Secondary | ICD-10-CM | POA: Diagnosis not present

## 2021-09-21 DIAGNOSIS — Z6836 Body mass index (BMI) 36.0-36.9, adult: Secondary | ICD-10-CM | POA: Diagnosis not present

## 2021-09-21 DIAGNOSIS — Z01419 Encounter for gynecological examination (general) (routine) without abnormal findings: Secondary | ICD-10-CM | POA: Diagnosis not present

## 2021-09-26 DIAGNOSIS — Z1231 Encounter for screening mammogram for malignant neoplasm of breast: Secondary | ICD-10-CM | POA: Diagnosis not present

## 2021-10-12 DIAGNOSIS — I10 Essential (primary) hypertension: Secondary | ICD-10-CM | POA: Diagnosis not present

## 2021-10-12 DIAGNOSIS — R5383 Other fatigue: Secondary | ICD-10-CM | POA: Diagnosis not present

## 2021-10-12 DIAGNOSIS — Z1331 Encounter for screening for depression: Secondary | ICD-10-CM | POA: Diagnosis not present

## 2021-10-12 DIAGNOSIS — E669 Obesity, unspecified: Secondary | ICD-10-CM | POA: Diagnosis not present

## 2021-10-12 DIAGNOSIS — Z6836 Body mass index (BMI) 36.0-36.9, adult: Secondary | ICD-10-CM | POA: Diagnosis not present

## 2021-10-12 DIAGNOSIS — E782 Mixed hyperlipidemia: Secondary | ICD-10-CM | POA: Diagnosis not present

## 2021-10-12 DIAGNOSIS — R0602 Shortness of breath: Secondary | ICD-10-CM | POA: Diagnosis not present

## 2021-10-12 DIAGNOSIS — R7303 Prediabetes: Secondary | ICD-10-CM | POA: Diagnosis not present

## 2021-10-17 DIAGNOSIS — E039 Hypothyroidism, unspecified: Secondary | ICD-10-CM | POA: Diagnosis not present

## 2021-10-26 DIAGNOSIS — E039 Hypothyroidism, unspecified: Secondary | ICD-10-CM | POA: Diagnosis not present

## 2021-10-26 DIAGNOSIS — I10 Essential (primary) hypertension: Secondary | ICD-10-CM | POA: Diagnosis not present

## 2021-10-26 DIAGNOSIS — Z6836 Body mass index (BMI) 36.0-36.9, adult: Secondary | ICD-10-CM | POA: Diagnosis not present

## 2021-11-23 DIAGNOSIS — E039 Hypothyroidism, unspecified: Secondary | ICD-10-CM | POA: Diagnosis not present

## 2021-11-23 DIAGNOSIS — E669 Obesity, unspecified: Secondary | ICD-10-CM | POA: Diagnosis not present

## 2021-11-23 DIAGNOSIS — I10 Essential (primary) hypertension: Secondary | ICD-10-CM | POA: Diagnosis not present

## 2021-12-27 DIAGNOSIS — I10 Essential (primary) hypertension: Secondary | ICD-10-CM | POA: Diagnosis not present

## 2021-12-27 DIAGNOSIS — E669 Obesity, unspecified: Secondary | ICD-10-CM | POA: Diagnosis not present

## 2021-12-27 DIAGNOSIS — E039 Hypothyroidism, unspecified: Secondary | ICD-10-CM | POA: Diagnosis not present

## 2022-01-30 DIAGNOSIS — E039 Hypothyroidism, unspecified: Secondary | ICD-10-CM | POA: Diagnosis not present

## 2022-01-30 DIAGNOSIS — Z6834 Body mass index (BMI) 34.0-34.9, adult: Secondary | ICD-10-CM | POA: Diagnosis not present

## 2022-01-30 DIAGNOSIS — E669 Obesity, unspecified: Secondary | ICD-10-CM | POA: Diagnosis not present

## 2022-01-30 DIAGNOSIS — I10 Essential (primary) hypertension: Secondary | ICD-10-CM | POA: Diagnosis not present

## 2022-02-22 DIAGNOSIS — E039 Hypothyroidism, unspecified: Secondary | ICD-10-CM | POA: Diagnosis not present

## 2022-02-22 DIAGNOSIS — G8929 Other chronic pain: Secondary | ICD-10-CM | POA: Diagnosis not present

## 2022-02-22 DIAGNOSIS — R7303 Prediabetes: Secondary | ICD-10-CM | POA: Diagnosis not present

## 2022-02-22 DIAGNOSIS — F325 Major depressive disorder, single episode, in full remission: Secondary | ICD-10-CM | POA: Diagnosis not present

## 2022-02-22 DIAGNOSIS — I10 Essential (primary) hypertension: Secondary | ICD-10-CM | POA: Diagnosis not present

## 2022-02-22 DIAGNOSIS — G2581 Restless legs syndrome: Secondary | ICD-10-CM | POA: Diagnosis not present

## 2022-02-22 DIAGNOSIS — E782 Mixed hyperlipidemia: Secondary | ICD-10-CM | POA: Diagnosis not present

## 2022-03-02 DIAGNOSIS — R7303 Prediabetes: Secondary | ICD-10-CM | POA: Diagnosis not present

## 2022-03-02 DIAGNOSIS — Z6834 Body mass index (BMI) 34.0-34.9, adult: Secondary | ICD-10-CM | POA: Diagnosis not present

## 2022-03-02 DIAGNOSIS — E669 Obesity, unspecified: Secondary | ICD-10-CM | POA: Diagnosis not present

## 2022-03-02 DIAGNOSIS — I10 Essential (primary) hypertension: Secondary | ICD-10-CM | POA: Diagnosis not present

## 2022-04-13 DIAGNOSIS — R7303 Prediabetes: Secondary | ICD-10-CM | POA: Diagnosis not present

## 2022-04-13 DIAGNOSIS — I10 Essential (primary) hypertension: Secondary | ICD-10-CM | POA: Diagnosis not present

## 2022-04-13 DIAGNOSIS — R053 Chronic cough: Secondary | ICD-10-CM | POA: Diagnosis not present

## 2022-04-13 DIAGNOSIS — Z6834 Body mass index (BMI) 34.0-34.9, adult: Secondary | ICD-10-CM | POA: Diagnosis not present

## 2022-04-13 DIAGNOSIS — E669 Obesity, unspecified: Secondary | ICD-10-CM | POA: Diagnosis not present

## 2022-05-22 DIAGNOSIS — Z6835 Body mass index (BMI) 35.0-35.9, adult: Secondary | ICD-10-CM | POA: Diagnosis not present

## 2022-05-22 DIAGNOSIS — R7303 Prediabetes: Secondary | ICD-10-CM | POA: Diagnosis not present

## 2022-05-22 DIAGNOSIS — I10 Essential (primary) hypertension: Secondary | ICD-10-CM | POA: Diagnosis not present

## 2022-06-13 DIAGNOSIS — R7303 Prediabetes: Secondary | ICD-10-CM | POA: Diagnosis not present

## 2022-06-13 DIAGNOSIS — E669 Obesity, unspecified: Secondary | ICD-10-CM | POA: Diagnosis not present

## 2022-06-13 DIAGNOSIS — I10 Essential (primary) hypertension: Secondary | ICD-10-CM | POA: Diagnosis not present

## 2022-06-13 DIAGNOSIS — Z6834 Body mass index (BMI) 34.0-34.9, adult: Secondary | ICD-10-CM | POA: Diagnosis not present

## 2022-07-24 DIAGNOSIS — E669 Obesity, unspecified: Secondary | ICD-10-CM | POA: Diagnosis not present

## 2022-07-24 DIAGNOSIS — Z6834 Body mass index (BMI) 34.0-34.9, adult: Secondary | ICD-10-CM | POA: Diagnosis not present

## 2022-07-24 DIAGNOSIS — R7303 Prediabetes: Secondary | ICD-10-CM | POA: Diagnosis not present

## 2022-07-24 DIAGNOSIS — I10 Essential (primary) hypertension: Secondary | ICD-10-CM | POA: Diagnosis not present

## 2022-08-23 DIAGNOSIS — R7303 Prediabetes: Secondary | ICD-10-CM | POA: Diagnosis not present

## 2022-08-23 DIAGNOSIS — Z6833 Body mass index (BMI) 33.0-33.9, adult: Secondary | ICD-10-CM | POA: Diagnosis not present

## 2022-08-23 DIAGNOSIS — I10 Essential (primary) hypertension: Secondary | ICD-10-CM | POA: Diagnosis not present

## 2022-08-23 DIAGNOSIS — E669 Obesity, unspecified: Secondary | ICD-10-CM | POA: Diagnosis not present

## 2022-09-13 DIAGNOSIS — G8929 Other chronic pain: Secondary | ICD-10-CM | POA: Diagnosis not present

## 2022-09-13 DIAGNOSIS — Z1331 Encounter for screening for depression: Secondary | ICD-10-CM | POA: Diagnosis not present

## 2022-09-13 DIAGNOSIS — M76891 Other specified enthesopathies of right lower limb, excluding foot: Secondary | ICD-10-CM | POA: Diagnosis not present

## 2022-09-13 DIAGNOSIS — E782 Mixed hyperlipidemia: Secondary | ICD-10-CM | POA: Diagnosis not present

## 2022-09-13 DIAGNOSIS — E039 Hypothyroidism, unspecified: Secondary | ICD-10-CM | POA: Diagnosis not present

## 2022-09-13 DIAGNOSIS — Z0000 Encounter for general adult medical examination without abnormal findings: Secondary | ICD-10-CM | POA: Diagnosis not present

## 2022-09-13 DIAGNOSIS — I10 Essential (primary) hypertension: Secondary | ICD-10-CM | POA: Diagnosis not present

## 2022-09-13 DIAGNOSIS — G43009 Migraine without aura, not intractable, without status migrainosus: Secondary | ICD-10-CM | POA: Diagnosis not present

## 2022-09-13 DIAGNOSIS — E669 Obesity, unspecified: Secondary | ICD-10-CM | POA: Diagnosis not present

## 2022-09-13 DIAGNOSIS — R7309 Other abnormal glucose: Secondary | ICD-10-CM | POA: Diagnosis not present

## 2022-09-13 DIAGNOSIS — R7303 Prediabetes: Secondary | ICD-10-CM | POA: Diagnosis not present

## 2022-09-13 DIAGNOSIS — M76899 Other specified enthesopathies of unspecified lower limb, excluding foot: Secondary | ICD-10-CM | POA: Diagnosis not present

## 2022-09-13 DIAGNOSIS — F325 Major depressive disorder, single episode, in full remission: Secondary | ICD-10-CM | POA: Diagnosis not present

## 2022-09-20 DIAGNOSIS — Z6833 Body mass index (BMI) 33.0-33.9, adult: Secondary | ICD-10-CM | POA: Diagnosis not present

## 2022-09-20 DIAGNOSIS — E669 Obesity, unspecified: Secondary | ICD-10-CM | POA: Diagnosis not present

## 2022-09-20 DIAGNOSIS — R7303 Prediabetes: Secondary | ICD-10-CM | POA: Diagnosis not present

## 2022-09-20 DIAGNOSIS — I10 Essential (primary) hypertension: Secondary | ICD-10-CM | POA: Diagnosis not present

## 2022-10-04 NOTE — Progress Notes (Unsigned)
   Rubin Payor, PhD, LAT, ATC acting as a scribe for Clementeen Graham, MD.  Dawn Jacobs is a 67 y.o. female who presents to [facility] at [facility] today for bilat knee pain x ***. Pt locates pain to ***  Knee swelling: Mechanical symptoms: Radiates: Aggravates: Treatments tried:  Pertinent review of systems: ***  Relevant historical information: ***   Exam:  There were no vitals taken for this visit. General: Well Developed, well nourished, and in no acute distress.   MSK: ***    Lab and Radiology Results No results found for this or any previous visit (from the past 72 hour(s)). No results found.     Assessment and Plan: 67 y.o. female with ***   PDMP not reviewed this encounter. No orders of the defined types were placed in this encounter.  No orders of the defined types were placed in this encounter.    Discussed warning signs or symptoms. Please see discharge instructions. Patient expresses understanding.   ***

## 2022-10-05 ENCOUNTER — Ambulatory Visit (INDEPENDENT_AMBULATORY_CARE_PROVIDER_SITE_OTHER): Payer: PPO

## 2022-10-05 ENCOUNTER — Ambulatory Visit: Payer: PPO | Admitting: Family Medicine

## 2022-10-05 ENCOUNTER — Encounter: Payer: Self-pay | Admitting: Family Medicine

## 2022-10-05 ENCOUNTER — Other Ambulatory Visit: Payer: Self-pay

## 2022-10-05 VITALS — BP 122/74 | HR 96 | Ht 63.5 in | Wt 188.8 lb

## 2022-10-05 DIAGNOSIS — M25561 Pain in right knee: Secondary | ICD-10-CM

## 2022-10-05 DIAGNOSIS — M25562 Pain in left knee: Secondary | ICD-10-CM

## 2022-10-05 DIAGNOSIS — G8929 Other chronic pain: Secondary | ICD-10-CM | POA: Diagnosis not present

## 2022-10-05 NOTE — Patient Instructions (Addendum)
Thank you for coming in today.  You received an injection today. Seek immediate medical attention if the joint becomes red, extremely painful, or is oozing fluid.  Please get an Xray today before you leave  

## 2022-10-09 NOTE — Progress Notes (Signed)
Right knee x-ray has some tendinitis as the quadriceps tendon attaches to the top of the kneecap.

## 2022-10-09 NOTE — Progress Notes (Signed)
Left knee x-ray shows some bone spurs at the tip of the kneecap where the tendon attaches.  Otherwise it looks okay.  No arthritis is visible.

## 2022-10-16 DIAGNOSIS — E669 Obesity, unspecified: Secondary | ICD-10-CM | POA: Diagnosis not present

## 2022-10-16 DIAGNOSIS — Z6832 Body mass index (BMI) 32.0-32.9, adult: Secondary | ICD-10-CM | POA: Diagnosis not present

## 2022-10-16 DIAGNOSIS — I10 Essential (primary) hypertension: Secondary | ICD-10-CM | POA: Diagnosis not present

## 2022-10-16 DIAGNOSIS — R7303 Prediabetes: Secondary | ICD-10-CM | POA: Diagnosis not present

## 2022-11-13 DIAGNOSIS — R7303 Prediabetes: Secondary | ICD-10-CM | POA: Diagnosis not present

## 2022-11-13 DIAGNOSIS — I10 Essential (primary) hypertension: Secondary | ICD-10-CM | POA: Diagnosis not present

## 2022-11-13 DIAGNOSIS — Z6832 Body mass index (BMI) 32.0-32.9, adult: Secondary | ICD-10-CM | POA: Diagnosis not present

## 2022-11-13 DIAGNOSIS — E669 Obesity, unspecified: Secondary | ICD-10-CM | POA: Diagnosis not present

## 2022-11-30 ENCOUNTER — Other Ambulatory Visit: Payer: Self-pay | Admitting: Obstetrics and Gynecology

## 2022-11-30 DIAGNOSIS — Z720 Tobacco use: Secondary | ICD-10-CM

## 2022-11-30 DIAGNOSIS — N959 Unspecified menopausal and perimenopausal disorder: Secondary | ICD-10-CM | POA: Diagnosis not present

## 2022-11-30 DIAGNOSIS — Z6833 Body mass index (BMI) 33.0-33.9, adult: Secondary | ICD-10-CM | POA: Diagnosis not present

## 2022-11-30 DIAGNOSIS — Z1231 Encounter for screening mammogram for malignant neoplasm of breast: Secondary | ICD-10-CM | POA: Diagnosis not present

## 2022-11-30 DIAGNOSIS — Z01419 Encounter for gynecological examination (general) (routine) without abnormal findings: Secondary | ICD-10-CM | POA: Diagnosis not present

## 2022-12-13 DIAGNOSIS — Z6832 Body mass index (BMI) 32.0-32.9, adult: Secondary | ICD-10-CM | POA: Diagnosis not present

## 2022-12-13 DIAGNOSIS — R7303 Prediabetes: Secondary | ICD-10-CM | POA: Diagnosis not present

## 2022-12-13 DIAGNOSIS — I10 Essential (primary) hypertension: Secondary | ICD-10-CM | POA: Diagnosis not present

## 2022-12-13 DIAGNOSIS — E669 Obesity, unspecified: Secondary | ICD-10-CM | POA: Diagnosis not present

## 2022-12-26 ENCOUNTER — Ambulatory Visit
Admission: RE | Admit: 2022-12-26 | Discharge: 2022-12-26 | Disposition: A | Discharge status: Home / Self Care | Payer: PPO | Source: Ambulatory Visit | Attending: Obstetrics and Gynecology | Admitting: Obstetrics and Gynecology

## 2022-12-26 DIAGNOSIS — Z87891 Personal history of nicotine dependence: Secondary | ICD-10-CM | POA: Diagnosis not present

## 2022-12-26 DIAGNOSIS — Z720 Tobacco use: Secondary | ICD-10-CM

## 2023-01-08 DIAGNOSIS — Z6831 Body mass index (BMI) 31.0-31.9, adult: Secondary | ICD-10-CM | POA: Diagnosis not present

## 2023-01-08 DIAGNOSIS — R7303 Prediabetes: Secondary | ICD-10-CM | POA: Diagnosis not present

## 2023-01-08 DIAGNOSIS — E669 Obesity, unspecified: Secondary | ICD-10-CM | POA: Diagnosis not present

## 2023-01-08 DIAGNOSIS — I10 Essential (primary) hypertension: Secondary | ICD-10-CM | POA: Diagnosis not present

## 2023-02-05 DIAGNOSIS — Z6831 Body mass index (BMI) 31.0-31.9, adult: Secondary | ICD-10-CM | POA: Diagnosis not present

## 2023-02-05 DIAGNOSIS — R7303 Prediabetes: Secondary | ICD-10-CM | POA: Diagnosis not present

## 2023-02-05 DIAGNOSIS — E669 Obesity, unspecified: Secondary | ICD-10-CM | POA: Diagnosis not present

## 2023-02-05 DIAGNOSIS — I10 Essential (primary) hypertension: Secondary | ICD-10-CM | POA: Diagnosis not present

## 2023-03-06 DIAGNOSIS — Z6831 Body mass index (BMI) 31.0-31.9, adult: Secondary | ICD-10-CM | POA: Diagnosis not present

## 2023-03-06 DIAGNOSIS — R7303 Prediabetes: Secondary | ICD-10-CM | POA: Diagnosis not present

## 2023-03-06 DIAGNOSIS — I10 Essential (primary) hypertension: Secondary | ICD-10-CM | POA: Diagnosis not present

## 2023-03-06 DIAGNOSIS — E669 Obesity, unspecified: Secondary | ICD-10-CM | POA: Diagnosis not present

## 2023-03-11 NOTE — Progress Notes (Unsigned)
Cardiology Office Note:    Date:  03/13/2023   ID:  Dawn Jacobs, DOB [birth year], MRN [id number]  PCP:  Merri Brunette, MD   [facility] Providers Cardiologist:  None     Referring MD: Richardean Chimera, MD   Chief Complaint  Patient presents with   Coronary Artery Disease    History of Present Illness:    Dawn Jacobs is a 67 y.o. female seen at the request of Merri Brunette MD for evaluation of coronary calcification. She has a history of HTN, HLD, and thyroid disease. History of tobacco abuse and family history of premature CAD. Recent CT chest done for cancer screening showed evidence of coronary calcification.   She states she is active doing yard work and has 8 grandchildren. She denies any chest pain, SOB or unusual fatigue. She quit smoking 5 years ago. Family history of cerebral aneurysm- states she has been checked and is OK. She is prediabetic.   Past Medical History:  Diagnosis Date   Chronic pain    High cholesterol    Hypertension    Restless legs    Thyroid disease     Past Surgical History:  Procedure Laterality Date   BACK SURGERY     NECK SURGERY      Current Medications: Current Meds  Medication Sig   aspirin EC 81 MG tablet Take 81 mg by mouth daily. Swallow whole.   b complex vitamins capsule Take 1 capsule by mouth daily.   DULoxetine (CYMBALTA) 60 MG capsule TAKE 1 CAPSULE BY MOUTH ONCE (1) DAILY   Estradiol (ESTRACE PO) Take by mouth.   HYDROcodone-acetaminophen (NORCO/VICODIN) 5-325 MG tablet Take 1 tablet by mouth every 6 (six) hours as needed for moderate pain.   Levothyroxine Sodium (SYNTHROID PO) Take by mouth.   lisinopril-hydrochlorothiazide (ZESTORETIC) 20-12.5 MG tablet TAKE 1 TABLET BY MOUTH ONCE (1) DAILY   MAGNESIUM PO Take by mouth.   Omega-3 Fatty Acids (FISH OIL PO) Take by mouth.   phentermine 15 MG capsule Take 15 mg by mouth every morning.   ROPINIROLE HCL ER PO Take by mouth.   rosuvastatin (CRESTOR) 40 MG  tablet Take 1 tablet (40 mg total) by mouth daily.   topiramate (TOPAMAX) 25 MG capsule Take 25 mg by mouth 2 (two) times daily.   VITAMIN D PO Take by mouth.   [DISCONTINUED] SIMVASTATIN PO Take 40 mg by mouth daily.     Allergies:   Venlafaxine   Social History   Socioeconomic History   Marital status: Married    Spouse name: Not on file   Number of children: Not on file   Years of education: Not on file   Highest education level: Not on file  Occupational History   Not on file  Tobacco Use   Smoking status: Former    Types: Cigarettes   Smokeless tobacco: Not on file  Substance and Sexual Activity   Alcohol use: No   Drug use: No   Sexual activity: Not on file  Other Topics Concern   Not on file  Social History Narrative   Not on file   Social Determinants of Health   Financial Resource Strain: Not on file  Food Insecurity: Not on file  Transportation Needs: Not on file  Physical Activity: Not on file  Stress: Not on file  Social Connections: Not on file     Family History: The patient's family history includes Cerebral aneurysm in her sister; Heart attack (age of  onset: 66) in her father.  ROS:   Please see the history of present illness.     All other systems reviewed and are negative.  EKGs/Labs/Other Studies Reviewed:    The following studies were reviewed today: EKG Interpretation Date/Time:  Tuesday March 13 2023 10:11:11 EST Ventricular Rate:  99 PR Interval:  136 QRS Duration:  86 QT Interval:  350 QTC Calculation: 449 R Axis:   66  Text Interpretation: Normal sinus rhythm Possible Left atrial enlargement Nonspecific ST abnormality When compared with ECG of 21-Oct-2007 12:40, nonspecific ST changes in Anterior leads Confirmed by [place], Lamaj Metoyer [phone]) on 03/13/2023 10:50:40 AM   EKG Interpretation Date/Time:  Tuesday March 13 2023 10:11:11 EST Ventricular Rate:  99 PR Interval:  136 QRS Duration:  86 QT Interval:  350 QTC  Calculation: 449 R Axis:   66  Text Interpretation: Normal sinus rhythm Possible Left atrial enlargement Nonspecific ST abnormality When compared with ECG of 21-Oct-2007 12:40, nonspecific ST changes in Anterior leads Confirmed by [place], Kinnedy Mongiello [phone]) on 03/13/2023 10:50:40 AM    Recent Labs: No results found for requested labs within last 365 days.  Recent Lipid Panel No results found for: "CHOL", "TRIG", "HDL", "CHOLHDL", "VLDL", "LDLCALC", "LDLDIRECT"  Dated 09/13/22: cholesterol 180, triglycerides 202, HDL 61, LDL 85. A1c 6.3%. CBC, CMET and TSH normal  Risk Assessment/Calculations:                Physical Exam:    VS:  BP 110/70 (BP Location: Right Arm, Patient Position: Sitting, Cuff Size: Normal)   Pulse 99   Ht 5\' 3"  (1.6 m)   Wt 180 lb (81.6 kg)   SpO2 96%   BMI 31.89 kg/m     Wt Readings from Last 3 Encounters:  03/13/23 180 lb (81.6 kg)  10/05/22 188 lb 12.8 oz (85.6 kg)  11/12/16 165 lb (74.8 kg)     GEN:  Well nourished, well developed in no acute distress HEENT: Normal NECK: No JVD; No carotid bruits LYMPHATICS: No lymphadenopathy CARDIAC: RRR, no murmurs, rubs, gallops RESPIRATORY:  Clear to auscultation without rales, wheezing or rhonchi  ABDOMEN: Soft, non-tender, non-distended MUSCULOSKELETAL:  No edema; No deformity  SKIN: Warm and dry NEUROLOGIC:  Alert and oriented x 3 PSYCHIATRIC:  Normal affect   ASSESSMENT:    1. Coronary artery calcification   2. Primary hypertension   3. Aortic atherosclerosis (HCC)   4. Mixed hyperlipidemia    PLAN:    In order of problems listed above:  Coronary artery calcification. No anginal symptoms. Need to focus on risk factor modification. This includes healthy lifestyle with Mediterranean type diet, regular exercise and weight control.  Aortic atherosclerosis. HLD mixed. Continue fish oil. Will switch simvastatin to Crestor 40 mg daily. Repeat lab 3 months. Goal LDL < 70 and ideally < 55.  HTN well  controlled.  Prediabetes. Carb restriction. Weight control Former smoker.             Medication Adjustments/Labs and Tests Ordered: Current medicines are reviewed at length with the patient today.  Concerns regarding medicines are outlined above.  Orders Placed This Encounter  Procedures   Basic metabolic panel   Lipid panel   Hepatic function panel   EKG 12-Lead   Meds ordered this encounter  Medications   rosuvastatin (CRESTOR) 40 MG tablet    Sig: Take 1 tablet (40 mg total) by mouth daily.    Dispense:  90 tablet    Refill:  3  Patient Instructions  Medication Instructions:  Stop Simvastatin Start Rosuvastatin 40 mg daily Continue all other medications *If you need a refill on your cardiac medications before your next appointment, please call your pharmacy*   Lab Work: Bmet,lipid and hepatic panels to be done in 3 months   Testing/Procedures: None ordered   Follow-Up: At [facility], you and your health needs are our priority.  As part of our continuing mission to provide you with exceptional heart care, we have created designated Provider Care Teams.  These Care Teams include your primary Cardiologist (physician) and Advanced Practice Providers (APPs -  Physician Assistants and Nurse Practitioners) who all work together to provide you with the care you need, when you need it.  We recommend signing up for the patient portal called "MyChart".  Sign up information is provided on this After Visit Summary.  MyChart is used to connect with patients for Virtual Visits (Telemedicine).  Patients are able to view lab/test results, encounter notes, upcoming appointments, etc.  Non-urgent messages can be sent to your provider as well.   To learn more about what you can do with MyChart, go to [email or web address].    Your next appointment:  1 year    Call in July to schedule Nov appointment     Provider:  Dr.Kinsley Nicklaus     Signed, Reneshia Zuccaro [place], MD   03/13/2023 10:50 AM    [facility]

## 2023-03-12 DIAGNOSIS — M25511 Pain in right shoulder: Secondary | ICD-10-CM | POA: Diagnosis not present

## 2023-03-12 DIAGNOSIS — Z23 Encounter for immunization: Secondary | ICD-10-CM | POA: Diagnosis not present

## 2023-03-12 DIAGNOSIS — G2581 Restless legs syndrome: Secondary | ICD-10-CM | POA: Diagnosis not present

## 2023-03-12 DIAGNOSIS — R7303 Prediabetes: Secondary | ICD-10-CM | POA: Diagnosis not present

## 2023-03-12 DIAGNOSIS — E669 Obesity, unspecified: Secondary | ICD-10-CM | POA: Diagnosis not present

## 2023-03-12 DIAGNOSIS — I10 Essential (primary) hypertension: Secondary | ICD-10-CM | POA: Diagnosis not present

## 2023-03-12 DIAGNOSIS — E782 Mixed hyperlipidemia: Secondary | ICD-10-CM | POA: Diagnosis not present

## 2023-03-12 DIAGNOSIS — F325 Major depressive disorder, single episode, in full remission: Secondary | ICD-10-CM | POA: Diagnosis not present

## 2023-03-12 DIAGNOSIS — E039 Hypothyroidism, unspecified: Secondary | ICD-10-CM | POA: Diagnosis not present

## 2023-03-13 ENCOUNTER — Ambulatory Visit: Payer: PPO | Attending: Cardiology | Admitting: Cardiology

## 2023-03-13 ENCOUNTER — Encounter: Payer: Self-pay | Admitting: Cardiology

## 2023-03-13 VITALS — BP 110/70 | HR 99 | Ht 63.0 in | Wt 180.0 lb

## 2023-03-13 DIAGNOSIS — I2510 Atherosclerotic heart disease of native coronary artery without angina pectoris: Secondary | ICD-10-CM | POA: Diagnosis not present

## 2023-03-13 DIAGNOSIS — I700 Atherosclerosis of aorta: Secondary | ICD-10-CM | POA: Diagnosis not present

## 2023-03-13 DIAGNOSIS — E782 Mixed hyperlipidemia: Secondary | ICD-10-CM | POA: Diagnosis not present

## 2023-03-13 DIAGNOSIS — I10 Essential (primary) hypertension: Secondary | ICD-10-CM

## 2023-03-13 MED ORDER — ROSUVASTATIN CALCIUM 40 MG PO TABS: 40.0000 mg | ORAL_TABLET | Freq: Every day | ORAL | 3 refills | Status: AC

## 2023-03-13 NOTE — Patient Instructions (Signed)
Medication Instructions:  Stop Simvastatin Start Rosuvastatin 40 mg daily Continue all other medications *If you need a refill on your cardiac medications before your next appointment, please call your pharmacy*   Lab Work: Bmet,lipid and hepatic panels to be done in 3 months   Testing/Procedures: None ordered   Follow-Up: At [facility], you and your health needs are our priority.  As part of our continuing mission to provide you with exceptional heart care, we have created designated Provider Care Teams.  These Care Teams include your primary Cardiologist (physician) and Advanced Practice Providers (APPs -  Physician Assistants and Nurse Practitioners) who all work together to provide you with the care you need, when you need it.  We recommend signing up for the patient portal called "MyChart".  Sign up information is provided on this After Visit Summary.  MyChart is used to connect with patients for Virtual Visits (Telemedicine).  Patients are able to view lab/test results, encounter notes, upcoming appointments, etc.  Non-urgent messages can be sent to your provider as well.   To learn more about what you can do with MyChart, go to [email or web address].    Your next appointment:  1 year    Call in July to schedule Nov appointment     Provider:  Dr.Jordan

## 2023-03-22 DIAGNOSIS — M25511 Pain in right shoulder: Secondary | ICD-10-CM | POA: Diagnosis not present

## 2023-04-17 DIAGNOSIS — E669 Obesity, unspecified: Secondary | ICD-10-CM | POA: Diagnosis not present

## 2023-04-17 DIAGNOSIS — I10 Essential (primary) hypertension: Secondary | ICD-10-CM | POA: Diagnosis not present

## 2023-04-17 DIAGNOSIS — R7303 Prediabetes: Secondary | ICD-10-CM | POA: Diagnosis not present

## 2023-04-17 DIAGNOSIS — Z6830 Body mass index (BMI) 30.0-30.9, adult: Secondary | ICD-10-CM | POA: Diagnosis not present

## 2023-04-17 DIAGNOSIS — I700 Atherosclerosis of aorta: Secondary | ICD-10-CM | POA: Diagnosis not present

## 2023-06-05 DIAGNOSIS — I2510 Atherosclerotic heart disease of native coronary artery without angina pectoris: Secondary | ICD-10-CM | POA: Diagnosis not present

## 2023-06-05 DIAGNOSIS — E782 Mixed hyperlipidemia: Secondary | ICD-10-CM | POA: Diagnosis not present

## 2023-06-05 DIAGNOSIS — I10 Essential (primary) hypertension: Secondary | ICD-10-CM | POA: Diagnosis not present

## 2023-06-05 DIAGNOSIS — I700 Atherosclerosis of aorta: Secondary | ICD-10-CM | POA: Diagnosis not present

## 2023-06-06 LAB — LIPID PANEL
Chol/HDL Ratio: 2.2 {ratio} (ref 0.0–4.4)
Cholesterol, Total: 148 mg/dL (ref 100–199)
HDL: 66 mg/dL (ref >39)
LDL Chol Calc (NIH): 58 mg/dL (ref 0–99)
Triglycerides: 143 mg/dL (ref 0–149)
VLDL Cholesterol Cal: 24 mg/dL (ref 5–40)

## 2023-06-06 LAB — HEPATIC FUNCTION PANEL
ALT: 23 [IU]/L (ref 0–32)
AST: 31 [IU]/L (ref 0–40)
Albumin: 4.4 g/dL (ref 3.9–4.9)
Alkaline Phosphatase: 62 [IU]/L (ref 44–121)
Bilirubin Total: 0.3 mg/dL (ref 0.0–1.2)
Bilirubin, Direct: 0.11 mg/dL (ref 0.00–0.40)
Total Protein: 6.8 g/dL (ref 6.0–8.5)

## 2023-06-06 LAB — BASIC METABOLIC PANEL
BUN/Creatinine Ratio: 24 (ref 12–28)
BUN: 19 mg/dL (ref 8–27)
CO2: 26 mmol/L (ref 20–29)
Calcium: 9.9 mg/dL (ref 8.7–10.3)
Chloride: 103 mmol/L (ref 96–106)
Creatinine, Ser: 0.79 mg/dL (ref 0.57–1.00)
Glucose: 95 mg/dL (ref 70–99)
Potassium: 5 mmol/L (ref 3.5–5.2)
Sodium: 142 mmol/L (ref 134–144)
eGFR: 81 mL/min/{1.73_m2} (ref >59)

## 2023-06-20 DIAGNOSIS — I700 Atherosclerosis of aorta: Secondary | ICD-10-CM | POA: Diagnosis not present

## 2023-06-20 DIAGNOSIS — R7303 Prediabetes: Secondary | ICD-10-CM | POA: Diagnosis not present

## 2023-06-20 DIAGNOSIS — E669 Obesity, unspecified: Secondary | ICD-10-CM | POA: Diagnosis not present

## 2023-06-20 DIAGNOSIS — Z6830 Body mass index (BMI) 30.0-30.9, adult: Secondary | ICD-10-CM | POA: Diagnosis not present

## 2023-06-20 DIAGNOSIS — I10 Essential (primary) hypertension: Secondary | ICD-10-CM | POA: Diagnosis not present

## 2023-06-22 DIAGNOSIS — M25511 Pain in right shoulder: Secondary | ICD-10-CM | POA: Diagnosis not present

## 2023-06-28 DIAGNOSIS — M25511 Pain in right shoulder: Secondary | ICD-10-CM | POA: Diagnosis not present

## 2023-07-03 DIAGNOSIS — M25511 Pain in right shoulder: Secondary | ICD-10-CM | POA: Diagnosis not present

## 2023-07-05 DIAGNOSIS — M25511 Pain in right shoulder: Secondary | ICD-10-CM | POA: Diagnosis not present

## 2023-07-16 DIAGNOSIS — M25511 Pain in right shoulder: Secondary | ICD-10-CM | POA: Diagnosis not present

## 2023-07-16 DIAGNOSIS — R202 Paresthesia of skin: Secondary | ICD-10-CM | POA: Diagnosis not present

## 2023-07-16 DIAGNOSIS — R200 Anesthesia of skin: Secondary | ICD-10-CM | POA: Diagnosis not present

## 2023-07-17 DIAGNOSIS — M25511 Pain in right shoulder: Secondary | ICD-10-CM | POA: Diagnosis not present

## 2023-07-20 DIAGNOSIS — M25511 Pain in right shoulder: Secondary | ICD-10-CM | POA: Diagnosis not present

## 2023-07-26 DIAGNOSIS — Z6830 Body mass index (BMI) 30.0-30.9, adult: Secondary | ICD-10-CM | POA: Diagnosis not present

## 2023-07-26 DIAGNOSIS — I700 Atherosclerosis of aorta: Secondary | ICD-10-CM | POA: Diagnosis not present

## 2023-07-26 DIAGNOSIS — E669 Obesity, unspecified: Secondary | ICD-10-CM | POA: Diagnosis not present

## 2023-07-26 DIAGNOSIS — R7303 Prediabetes: Secondary | ICD-10-CM | POA: Diagnosis not present

## 2023-07-26 DIAGNOSIS — I10 Essential (primary) hypertension: Secondary | ICD-10-CM | POA: Diagnosis not present

## 2023-08-30 DIAGNOSIS — Z6830 Body mass index (BMI) 30.0-30.9, adult: Secondary | ICD-10-CM | POA: Diagnosis not present

## 2023-08-30 DIAGNOSIS — I700 Atherosclerosis of aorta: Secondary | ICD-10-CM | POA: Diagnosis not present

## 2023-08-30 DIAGNOSIS — E669 Obesity, unspecified: Secondary | ICD-10-CM | POA: Diagnosis not present

## 2023-08-30 DIAGNOSIS — R7303 Prediabetes: Secondary | ICD-10-CM | POA: Diagnosis not present

## 2023-08-30 DIAGNOSIS — I10 Essential (primary) hypertension: Secondary | ICD-10-CM | POA: Diagnosis not present

## 2023-09-03 DIAGNOSIS — M25511 Pain in right shoulder: Secondary | ICD-10-CM | POA: Diagnosis not present

## 2023-09-27 DIAGNOSIS — I700 Atherosclerosis of aorta: Secondary | ICD-10-CM | POA: Diagnosis not present

## 2023-09-27 DIAGNOSIS — E663 Overweight: Secondary | ICD-10-CM | POA: Diagnosis not present

## 2023-09-27 DIAGNOSIS — I10 Essential (primary) hypertension: Secondary | ICD-10-CM | POA: Diagnosis not present

## 2023-09-27 DIAGNOSIS — Z6829 Body mass index (BMI) 29.0-29.9, adult: Secondary | ICD-10-CM | POA: Diagnosis not present

## 2023-09-27 DIAGNOSIS — Z8639 Personal history of other endocrine, nutritional and metabolic disease: Secondary | ICD-10-CM | POA: Diagnosis not present

## 2023-09-27 DIAGNOSIS — R7303 Prediabetes: Secondary | ICD-10-CM | POA: Diagnosis not present

## 2023-10-02 DIAGNOSIS — G2581 Restless legs syndrome: Secondary | ICD-10-CM | POA: Diagnosis not present

## 2023-10-02 DIAGNOSIS — Z0000 Encounter for general adult medical examination without abnormal findings: Secondary | ICD-10-CM | POA: Diagnosis not present

## 2023-10-02 DIAGNOSIS — Z1331 Encounter for screening for depression: Secondary | ICD-10-CM | POA: Diagnosis not present

## 2023-10-02 DIAGNOSIS — I10 Essential (primary) hypertension: Secondary | ICD-10-CM | POA: Diagnosis not present

## 2023-10-02 DIAGNOSIS — F17211 Nicotine dependence, cigarettes, in remission: Secondary | ICD-10-CM | POA: Diagnosis not present

## 2023-10-02 DIAGNOSIS — E039 Hypothyroidism, unspecified: Secondary | ICD-10-CM | POA: Diagnosis not present

## 2023-10-02 DIAGNOSIS — R7303 Prediabetes: Secondary | ICD-10-CM | POA: Diagnosis not present

## 2023-10-02 DIAGNOSIS — G8929 Other chronic pain: Secondary | ICD-10-CM | POA: Diagnosis not present

## 2023-10-02 DIAGNOSIS — E669 Obesity, unspecified: Secondary | ICD-10-CM | POA: Diagnosis not present

## 2023-10-02 DIAGNOSIS — R0989 Other specified symptoms and signs involving the circulatory and respiratory systems: Secondary | ICD-10-CM | POA: Diagnosis not present

## 2023-10-02 DIAGNOSIS — E782 Mixed hyperlipidemia: Secondary | ICD-10-CM | POA: Diagnosis not present

## 2023-10-22 DIAGNOSIS — I83813 Varicose veins of bilateral lower extremities with pain: Secondary | ICD-10-CM | POA: Diagnosis not present

## 2023-10-22 DIAGNOSIS — M7989 Other specified soft tissue disorders: Secondary | ICD-10-CM | POA: Diagnosis not present

## 2023-11-05 DIAGNOSIS — E663 Overweight: Secondary | ICD-10-CM | POA: Diagnosis not present

## 2023-11-05 DIAGNOSIS — Z8639 Personal history of other endocrine, nutritional and metabolic disease: Secondary | ICD-10-CM | POA: Diagnosis not present

## 2023-11-05 DIAGNOSIS — R7303 Prediabetes: Secondary | ICD-10-CM | POA: Diagnosis not present

## 2023-11-05 DIAGNOSIS — Z6829 Body mass index (BMI) 29.0-29.9, adult: Secondary | ICD-10-CM | POA: Diagnosis not present

## 2023-11-05 DIAGNOSIS — I700 Atherosclerosis of aorta: Secondary | ICD-10-CM | POA: Diagnosis not present

## 2023-11-05 DIAGNOSIS — I10 Essential (primary) hypertension: Secondary | ICD-10-CM | POA: Diagnosis not present

## 2023-11-06 DIAGNOSIS — M79661 Pain in right lower leg: Secondary | ICD-10-CM | POA: Diagnosis not present

## 2023-11-06 DIAGNOSIS — M79604 Pain in right leg: Secondary | ICD-10-CM | POA: Diagnosis not present

## 2023-11-06 DIAGNOSIS — I83893 Varicose veins of bilateral lower extremities with other complications: Secondary | ICD-10-CM | POA: Diagnosis not present

## 2023-11-06 DIAGNOSIS — M79662 Pain in left lower leg: Secondary | ICD-10-CM | POA: Diagnosis not present

## 2023-11-20 DIAGNOSIS — I83892 Varicose veins of left lower extremities with other complications: Secondary | ICD-10-CM | POA: Diagnosis not present

## 2023-11-27 DIAGNOSIS — I83891 Varicose veins of right lower extremities with other complications: Secondary | ICD-10-CM | POA: Diagnosis not present

## 2023-12-03 DIAGNOSIS — M25511 Pain in right shoulder: Secondary | ICD-10-CM | POA: Diagnosis not present

## 2023-12-04 DIAGNOSIS — I87392 Chronic venous hypertension (idiopathic) with other complications of left lower extremity: Secondary | ICD-10-CM | POA: Diagnosis not present

## 2023-12-04 DIAGNOSIS — I83892 Varicose veins of left lower extremities with other complications: Secondary | ICD-10-CM | POA: Diagnosis not present

## 2023-12-07 DIAGNOSIS — E039 Hypothyroidism, unspecified: Secondary | ICD-10-CM | POA: Diagnosis not present

## 2023-12-11 DIAGNOSIS — I83891 Varicose veins of right lower extremities with other complications: Secondary | ICD-10-CM | POA: Diagnosis not present

## 2023-12-11 DIAGNOSIS — I87391 Chronic venous hypertension (idiopathic) with other complications of right lower extremity: Secondary | ICD-10-CM | POA: Diagnosis not present

## 2023-12-13 DIAGNOSIS — I700 Atherosclerosis of aorta: Secondary | ICD-10-CM | POA: Diagnosis not present

## 2023-12-13 DIAGNOSIS — Z6828 Body mass index (BMI) 28.0-28.9, adult: Secondary | ICD-10-CM | POA: Diagnosis not present

## 2023-12-13 DIAGNOSIS — I10 Essential (primary) hypertension: Secondary | ICD-10-CM | POA: Diagnosis not present

## 2023-12-13 DIAGNOSIS — Z8639 Personal history of other endocrine, nutritional and metabolic disease: Secondary | ICD-10-CM | POA: Diagnosis not present

## 2023-12-13 DIAGNOSIS — R7303 Prediabetes: Secondary | ICD-10-CM | POA: Diagnosis not present

## 2023-12-13 DIAGNOSIS — E663 Overweight: Secondary | ICD-10-CM | POA: Diagnosis not present

## 2023-12-18 DIAGNOSIS — I83812 Varicose veins of left lower extremities with pain: Secondary | ICD-10-CM | POA: Diagnosis not present

## 2023-12-18 DIAGNOSIS — I83892 Varicose veins of left lower extremities with other complications: Secondary | ICD-10-CM | POA: Diagnosis not present

## 2023-12-18 DIAGNOSIS — M7989 Other specified soft tissue disorders: Secondary | ICD-10-CM | POA: Diagnosis not present

## 2023-12-25 DIAGNOSIS — I87391 Chronic venous hypertension (idiopathic) with other complications of right lower extremity: Secondary | ICD-10-CM | POA: Diagnosis not present

## 2023-12-25 DIAGNOSIS — I83891 Varicose veins of right lower extremities with other complications: Secondary | ICD-10-CM | POA: Diagnosis not present

## 2024-01-17 DIAGNOSIS — I10 Essential (primary) hypertension: Secondary | ICD-10-CM | POA: Diagnosis not present

## 2024-01-17 DIAGNOSIS — Z6828 Body mass index (BMI) 28.0-28.9, adult: Secondary | ICD-10-CM | POA: Diagnosis not present

## 2024-01-17 DIAGNOSIS — E663 Overweight: Secondary | ICD-10-CM | POA: Diagnosis not present

## 2024-01-17 DIAGNOSIS — Z8639 Personal history of other endocrine, nutritional and metabolic disease: Secondary | ICD-10-CM | POA: Diagnosis not present

## 2024-01-17 DIAGNOSIS — I700 Atherosclerosis of aorta: Secondary | ICD-10-CM | POA: Diagnosis not present

## 2024-01-17 DIAGNOSIS — R7303 Prediabetes: Secondary | ICD-10-CM | POA: Diagnosis not present

## 2024-02-05 DIAGNOSIS — Z124 Encounter for screening for malignant neoplasm of cervix: Secondary | ICD-10-CM | POA: Diagnosis not present

## 2024-02-05 DIAGNOSIS — Z1231 Encounter for screening mammogram for malignant neoplasm of breast: Secondary | ICD-10-CM | POA: Diagnosis not present

## 2024-02-05 DIAGNOSIS — Z6830 Body mass index (BMI) 30.0-30.9, adult: Secondary | ICD-10-CM | POA: Diagnosis not present

## 2024-02-05 DIAGNOSIS — Z1272 Encounter for screening for malignant neoplasm of vagina: Secondary | ICD-10-CM | POA: Diagnosis not present

## 2024-02-05 DIAGNOSIS — Z1151 Encounter for screening for human papillomavirus (HPV): Secondary | ICD-10-CM | POA: Diagnosis not present

## 2024-02-06 ENCOUNTER — Other Ambulatory Visit: Payer: Self-pay | Admitting: Obstetrics and Gynecology

## 2024-02-06 DIAGNOSIS — Z720 Tobacco use: Secondary | ICD-10-CM

## 2024-02-07 DIAGNOSIS — I83893 Varicose veins of bilateral lower extremities with other complications: Secondary | ICD-10-CM | POA: Diagnosis not present

## 2024-02-07 DIAGNOSIS — G2581 Restless legs syndrome: Secondary | ICD-10-CM | POA: Diagnosis not present

## 2024-02-07 DIAGNOSIS — R252 Cramp and spasm: Secondary | ICD-10-CM | POA: Diagnosis not present

## 2024-02-07 DIAGNOSIS — R600 Localized edema: Secondary | ICD-10-CM | POA: Diagnosis not present

## 2024-02-07 DIAGNOSIS — I872 Venous insufficiency (chronic) (peripheral): Secondary | ICD-10-CM | POA: Diagnosis not present

## 2024-03-04 DIAGNOSIS — I10 Essential (primary) hypertension: Secondary | ICD-10-CM | POA: Diagnosis not present

## 2024-03-04 DIAGNOSIS — R7303 Prediabetes: Secondary | ICD-10-CM | POA: Diagnosis not present

## 2024-03-04 DIAGNOSIS — I700 Atherosclerosis of aorta: Secondary | ICD-10-CM | POA: Diagnosis not present

## 2024-03-04 DIAGNOSIS — Z6828 Body mass index (BMI) 28.0-28.9, adult: Secondary | ICD-10-CM | POA: Diagnosis not present

## 2024-03-04 DIAGNOSIS — E663 Overweight: Secondary | ICD-10-CM | POA: Diagnosis not present

## 2024-03-04 DIAGNOSIS — M25511 Pain in right shoulder: Secondary | ICD-10-CM | POA: Diagnosis not present

## 2024-03-04 DIAGNOSIS — Z8639 Personal history of other endocrine, nutritional and metabolic disease: Secondary | ICD-10-CM | POA: Diagnosis not present

## 2024-03-05 NOTE — Progress Notes (Signed)
 Cardiology Office Note:    Date:  03/11/2024   ID:  Dawn Jacobs, DOB Aug 16, 1955, MRN [id number]  PCP:  Claudene Pellet, MD   [facility] Providers Cardiologist:  None     Referring MD: Claudene Pellet, MD   Chief Complaint  Patient presents with   Coronary Artery Disease    History of Present Illness:    Dawn Jacobs is a 68 y.o. female seen at the request of Pellet Claudene MD for evaluation of coronary calcification. She has a history of HTN, HLD, and thyroid  disease. History of tobacco abuse and family history of premature CAD. Recent CT chest done for cancer screening showed evidence of coronary calcification.   She states she is active doing yard work and has 8 grandchildren. She denies any chest pain, SOB or unusual fatigue. She has not resumed smoking.   Past Medical History:  Diagnosis Date   Chronic pain    High cholesterol    Hypertension    Restless legs    Thyroid  disease     Past Surgical History:  Procedure Laterality Date   BACK SURGERY     NECK SURGERY      Current Medications: Current Meds  Medication Sig   aspirin EC 81 MG tablet Take 81 mg by mouth daily. Swallow whole.   b complex vitamins capsule Take 1 capsule by mouth daily.   DULoxetine (CYMBALTA) 60 MG capsule TAKE 1 CAPSULE BY MOUTH ONCE (1) DAILY   Estradiol (ESTRACE PO) Take by mouth.   HYDROcodone-acetaminophen (NORCO/VICODIN) 5-325 MG tablet Take 1 tablet by mouth every 6 (six) hours as needed for moderate pain.   Levothyroxine Sodium (SYNTHROID PO) Take by mouth. (Patient taking differently: Take 75 mcg by mouth daily before breakfast.)   lisinopril-hydrochlorothiazide (ZESTORETIC) 20-12.5 MG tablet TAKE 1 TABLET BY MOUTH ONCE (1) DAILY   MAGNESIUM PO Take by mouth.   Omega-3 Fatty Acids (FISH OIL PO) Take by mouth.   phentermine 15 MG capsule Take 15 mg by mouth every morning.   ROPINIROLE HCL ER PO Take by mouth.   rosuvastatin  (CRESTOR ) 40 MG tablet Take 1 tablet  (40 mg total) by mouth daily.   topiramate (TOPAMAX) 25 MG capsule Take 25 mg by mouth 2 (two) times daily.   VITAMIN D PO Take by mouth.     Allergies:   Venlafaxine   Social History   Socioeconomic History   Marital status: Married    Spouse name: Not on file   Number of children: Not on file   Years of education: Not on file   Highest education level: Not on file  Occupational History   Not on file  Tobacco Use   Smoking status: Former    Types: Cigarettes   Smokeless tobacco: Not on file  Substance and Sexual Activity   Alcohol use: No   Drug use: No   Sexual activity: Not on file  Other Topics Concern   Not on file  Social History Narrative   Not on file   Social Drivers of Health   Financial Resource Strain: Not on file  Food Insecurity: Not on file  Transportation Needs: Not on file  Physical Activity: Not on file  Stress: Not on file  Social Connections: Not on file     Family History: The patient's family history includes Cerebral aneurysm in her sister; Heart attack (age of onset: 81) in her father.  ROS:   Please see the history of present illness.  All other systems reviewed and are negative.  EKGs/Labs/Other Studies Reviewed:    The following studies were reviewed today: EKG Interpretation Date/Time:  Tuesday March 11 2024 10:03:50 EST Ventricular Rate:  90 PR Interval:  126 QRS Duration:  84 QT Interval:  374 QTC Calculation: 457 R Axis:   57  Text Interpretation: Normal sinus rhythm Normal ECG When compared with ECG of 13-Mar-2023 10:11, No significant change was found Confirmed by Annesha Delgreco [phone]) on 03/11/2024 10:05:13 AM  EKG Interpretation Date/Time:  Tuesday March 11 2024 10:03:50 EST Ventricular Rate:  90 PR Interval:  126 QRS Duration:  84 QT Interval:  374 QTC Calculation: 457 R Axis:   57  Text Interpretation: Normal sinus rhythm Normal ECG When compared with ECG of 13-Mar-2023 10:11, No significant change was  found Confirmed by Karra Pink [phone]) on 03/11/2024 10:05:13 AM   EKG Interpretation Date/Time:  Tuesday March 11 2024 10:03:50 EST Ventricular Rate:  90 PR Interval:  126 QRS Duration:  84 QT Interval:  374 QTC Calculation: 457 R Axis:   57  Text Interpretation: Normal sinus rhythm Normal ECG When compared with ECG of 13-Mar-2023 10:11, No significant change was found Confirmed by Maeleigh Buschman [phone]) on 03/11/2024 10:05:13 AM    Recent Labs: 06/05/2023: ALT 23; BUN 19; Creatinine, Ser 0.79; Potassium 5.0; Sodium 142  Recent Lipid Panel    Component Value Date/Time   CHOL 148 06/05/2023 1140   TRIG 143 06/05/2023 1140   HDL 66 06/05/2023 1140   CHOLHDL 2.2 06/05/2023 1140   LDLCALC 58 06/05/2023 1140    Dated 09/13/22: cholesterol 180, triglycerides 202, HDL 61, LDL 85. A1c 6.3%. CBC, CMET and TSH normal  Risk Assessment/Calculations:                Physical Exam:    VS:  BP 114/62 (BP Location: Left Arm, Patient Position: Sitting, Cuff Size: Large)   Pulse 90   Ht 5' 2.5 (1.588 m)   Wt 168 lb (76.2 kg)   SpO2 94%   BMI 30.24 kg/m     Wt Readings from Last 3 Encounters:  03/11/24 168 lb (76.2 kg)  03/13/23 180 lb (81.6 kg)  10/05/22 188 lb 12.8 oz (85.6 kg)     GEN:  Well nourished, well developed in no acute distress HEENT: Normal NECK: No JVD; No carotid bruits LYMPHATICS: No lymphadenopathy CARDIAC: RRR, no murmurs, rubs, gallops RESPIRATORY:  Clear to auscultation without rales, wheezing or rhonchi  ABDOMEN: Soft, non-tender, non-distended MUSCULOSKELETAL:  No edema; No deformity  SKIN: Warm and dry NEUROLOGIC:  Alert and oriented x 3 PSYCHIATRIC:  Normal affect   ASSESSMENT:    1. Primary hypertension   2. Coronary artery calcification   3. Mixed hyperlipidemia   4. Aortic atherosclerosis     PLAN:    In order of problems listed above:  Coronary artery calcification. No anginal symptoms. Continue risk factor modification. This  includes healthy lifestyle with Mediterranean type diet, regular exercise and weight control.  Aortic atherosclerosis. HLD mixed.On Crestor  40 mg daily. Repeat labs showed LDL down to 58. Triglycerides normal so doesn't need to take fish oil for now.  HTN well controlled.  Prediabetes. Carb restriction. Sugar normal. Weight control Former smoker.       Follow up in one year      Medication Adjustments/Labs and Tests Ordered: Current medicines are reviewed at length with the patient today.  Concerns regarding medicines are outlined above.  Orders Placed This Encounter  Procedures   EKG 12-Lead   No orders of the defined types were placed in this encounter.   Patient Instructions  Medication Instructions:   *If you need a refill on your cardiac medications before your next appointment, please call your pharmacy*  Lab Work:   Testing/Procedures:   Follow-Up: At [facility], you and your health needs are our priority.  As part of our continuing mission to provide you with exceptional heart care, our providers are all part of one team.  This team includes your primary Cardiologist (physician) and Advanced Practice Providers or APPs (Physician Assistants and Nurse Practitioners) who all work together to provide you with the care you need, when you need it.  Your next appointment:      Provider:      We recommend signing up for the patient portal called MyChart.  Sign up information is provided on this After Visit Summary.  MyChart is used to connect with patients for Virtual Visits (Telemedicine).  Patients are able to view lab/test results, encounter notes, upcoming appointments, etc.  Non-urgent messages can be sent to your provider as well.   To learn more about what you can do with MyChart, go to [email or web address].      Signed, Namiah Dunnavant, MD  03/11/2024 10:10 AM    [facility]

## 2024-03-11 ENCOUNTER — Ambulatory Visit: Attending: Cardiology | Admitting: Cardiology

## 2024-03-11 ENCOUNTER — Encounter: Payer: Self-pay | Admitting: Cardiology

## 2024-03-11 VITALS — BP 114/62 | HR 90 | Ht 62.5 in | Wt 168.0 lb

## 2024-03-11 DIAGNOSIS — M75121 Complete rotator cuff tear or rupture of right shoulder, not specified as traumatic: Secondary | ICD-10-CM | POA: Diagnosis not present

## 2024-03-11 DIAGNOSIS — E782 Mixed hyperlipidemia: Secondary | ICD-10-CM

## 2024-03-11 DIAGNOSIS — I2510 Atherosclerotic heart disease of native coronary artery without angina pectoris: Secondary | ICD-10-CM

## 2024-03-11 DIAGNOSIS — M19011 Primary osteoarthritis, right shoulder: Secondary | ICD-10-CM | POA: Diagnosis not present

## 2024-03-11 DIAGNOSIS — I10 Essential (primary) hypertension: Secondary | ICD-10-CM

## 2024-03-11 DIAGNOSIS — I700 Atherosclerosis of aorta: Secondary | ICD-10-CM | POA: Diagnosis not present

## 2024-03-11 DIAGNOSIS — M7521 Bicipital tendinitis, right shoulder: Secondary | ICD-10-CM | POA: Diagnosis not present

## 2024-03-11 DIAGNOSIS — M25819 Other specified joint disorders, unspecified shoulder: Secondary | ICD-10-CM | POA: Diagnosis not present

## 2024-03-11 NOTE — Patient Instructions (Addendum)
 Medication Instructions:  Continue same medications *If you need a refill on your cardiac medications before your next appointment, please call your pharmacy*  Lab Work: None ordered  Testing/Procedures: None ordered  Follow-Up: At [facility], you and your health needs are our priority.  As part of our continuing mission to provide you with exceptional heart care, our providers are all part of one team.  This team includes your primary Cardiologist (physician) and Advanced Practice Providers or APPs (Physician Assistants and Nurse Practitioners) who all work together to provide you with the care you need, when you need it.  Your next appointment:  1 year     Call in August to schedule Nov appointment     Provider:  Dr.Jordan   We recommend signing up for the patient portal called MyChart.  Sign up information is provided on this After Visit Summary.  MyChart is used to connect with patients for Virtual Visits (Telemedicine).  Patients are able to view lab/test results, encounter notes, upcoming appointments, etc.  Non-urgent messages can be sent to your provider as well.   To learn more about what you can do with MyChart, go to [email or web address].
